# Patient Record
Sex: Male | Born: 1965 | Race: White | Hispanic: No | Marital: Single | Smoking: Light tobacco smoker
Health system: Southern US, Community
[De-identification: ages and names within clinical notes are randomized; demographics above are authoritative.]

## PROBLEM LIST (undated history)

## (undated) DIAGNOSIS — F419 Anxiety disorder, unspecified: Secondary | ICD-10-CM

## (undated) DIAGNOSIS — I499 Cardiac arrhythmia, unspecified: Secondary | ICD-10-CM

## (undated) HISTORY — DX: Cardiac arrhythmia, unspecified: I49.9

## (undated) HISTORY — PX: OTHER SURGICAL HISTORY: SHX169

---

## 2009-11-01 ENCOUNTER — Ambulatory Visit: Payer: Self-pay | Admitting: Diagnostic Radiology

## 2009-11-01 ENCOUNTER — Emergency Department (HOSPITAL_BASED_OUTPATIENT_CLINIC_OR_DEPARTMENT_OTHER): Admission: EM | Admit: 2009-11-01 | Discharge: 2009-11-01 | Payer: Self-pay | Admitting: Emergency Medicine

## 2010-10-01 ENCOUNTER — Ambulatory Visit: Payer: Self-pay | Admitting: Diagnostic Radiology

## 2010-10-01 ENCOUNTER — Emergency Department (HOSPITAL_BASED_OUTPATIENT_CLINIC_OR_DEPARTMENT_OTHER): Admission: EM | Admit: 2010-10-01 | Discharge: 2010-10-02 | Payer: Self-pay | Admitting: Emergency Medicine

## 2011-01-27 ENCOUNTER — Emergency Department (HOSPITAL_BASED_OUTPATIENT_CLINIC_OR_DEPARTMENT_OTHER)
Admission: EM | Admit: 2011-01-27 | Discharge: 2011-01-28 | Disposition: A | Discharge status: Home / Self Care | Payer: BC Managed Care – PPO | Attending: Emergency Medicine | Admitting: Emergency Medicine

## 2011-01-27 ENCOUNTER — Emergency Department (HOSPITAL_BASED_OUTPATIENT_CLINIC_OR_DEPARTMENT_OTHER): Payer: BC Managed Care – PPO

## 2011-01-27 DIAGNOSIS — F341 Dysthymic disorder: Secondary | ICD-10-CM | POA: Insufficient documentation

## 2011-01-27 DIAGNOSIS — R609 Edema, unspecified: Secondary | ICD-10-CM | POA: Insufficient documentation

## 2011-01-27 DIAGNOSIS — Z79899 Other long term (current) drug therapy: Secondary | ICD-10-CM | POA: Insufficient documentation

## 2011-01-27 DIAGNOSIS — M7989 Other specified soft tissue disorders: Secondary | ICD-10-CM | POA: Insufficient documentation

## 2011-01-27 DIAGNOSIS — L299 Pruritus, unspecified: Secondary | ICD-10-CM | POA: Insufficient documentation

## 2011-01-28 ENCOUNTER — Ambulatory Visit (HOSPITAL_BASED_OUTPATIENT_CLINIC_OR_DEPARTMENT_OTHER)
Admit: 2011-01-28 | Discharge: 2011-01-28 | Disposition: A | Discharge status: Home / Self Care | Payer: BC Managed Care – PPO | Attending: Emergency Medicine | Admitting: Emergency Medicine

## 2011-01-28 DIAGNOSIS — M7989 Other specified soft tissue disorders: Secondary | ICD-10-CM | POA: Insufficient documentation

## 2011-02-06 LAB — CBC
HCT: 50.2 % (ref 39.0–52.0)
Hemoglobin: 17.4 g/dL — ABNORMAL HIGH (ref 13.0–17.0)
MCH: 31.1 pg (ref 26.0–34.0)
MCHC: 34.7 g/dL (ref 30.0–36.0)
RDW: 12.2 % (ref 11.5–15.5)

## 2011-02-06 LAB — COMPREHENSIVE METABOLIC PANEL
CO2: 24 mEq/L (ref 19–32)
Calcium: 9.6 mg/dL (ref 8.4–10.5)
Creatinine, Ser: 1.1 mg/dL (ref 0.4–1.5)
GFR calc Af Amer: >60 mL/min (ref >60)
GFR calc non Af Amer: >60 mL/min (ref >60)
Glucose, Bld: 124 mg/dL — ABNORMAL HIGH (ref 70–99)
Sodium: 144 mEq/L (ref 135–145)
Total Protein: 8.2 g/dL (ref 6.0–8.3)

## 2011-02-06 LAB — DIFFERENTIAL
Basophils Absolute: 0.2 10*3/uL — ABNORMAL HIGH (ref 0.0–0.1)
Eosinophils Relative: 1 % (ref 0–5)
Lymphocytes Relative: 4 % — ABNORMAL LOW (ref 12–46)
Monocytes Relative: 11 % (ref 3–12)

## 2011-02-06 LAB — LIPASE, BLOOD: Lipase: 100 U/L (ref 23–300)

## 2011-02-27 LAB — CBC
HCT: 44.6 % (ref 39.0–52.0)
Hemoglobin: 15.3 g/dL (ref 13.0–17.0)
MCHC: 34.3 g/dL (ref 30.0–36.0)
MCV: 90.2 fL (ref 78.0–100.0)
Platelets: 253 10*3/uL (ref 150–400)
RBC: 4.95 MIL/uL (ref 4.22–5.81)
RDW: 12.6 % (ref 11.5–15.5)
WBC: 7.4 10*3/uL (ref 4.0–10.5)

## 2011-02-27 LAB — BASIC METABOLIC PANEL WITH GFR
BUN: 11 mg/dL (ref 6–23)
CO2: 28 meq/L (ref 19–32)
Calcium: 9.7 mg/dL (ref 8.4–10.5)
Creatinine, Ser: 0.9 mg/dL (ref 0.4–1.5)
Glucose, Bld: 96 mg/dL (ref 70–99)

## 2011-02-27 LAB — DIFFERENTIAL
Basophils Absolute: 0.3 K/uL — ABNORMAL HIGH (ref 0.0–0.1)
Basophils Relative: 4 % — ABNORMAL HIGH (ref 0–1)
Eosinophils Absolute: 0.1 K/uL (ref 0.0–0.7)
Eosinophils Relative: 2 % (ref 0–5)
Lymphocytes Relative: 27 % (ref 12–46)
Lymphs Abs: 2 10*3/uL (ref 0.7–4.0)
Monocytes Absolute: 0.4 10*3/uL (ref 0.1–1.0)
Monocytes Relative: 6 % (ref 3–12)
Neutro Abs: 4.6 K/uL (ref 1.7–7.7)
Neutrophils Relative %: 62 % (ref 43–77)

## 2011-02-27 LAB — POCT CARDIAC MARKERS
CKMB, poc: <1 ng/mL — ABNORMAL LOW (ref 1.0–8.0)
CKMB, poc: <1 ng/mL — ABNORMAL LOW (ref 1.0–8.0)
Myoglobin, poc: 40 ng/mL (ref 12–200)
Myoglobin, poc: 63.7 ng/mL (ref 12–200)
Troponin i, poc: <0.05 ng/mL (ref 0.00–0.09)
Troponin i, poc: <0.05 ng/mL (ref 0.00–0.09)

## 2011-02-27 LAB — BASIC METABOLIC PANEL
Chloride: 103 mEq/L (ref 96–112)
GFR calc Af Amer: >60 mL/min (ref >60)
GFR calc non Af Amer: >60 mL/min (ref >60)
Potassium: 3.9 mEq/L (ref 3.5–5.1)
Sodium: 143 mEq/L (ref 135–145)

## 2014-01-04 ENCOUNTER — Encounter (HOSPITAL_BASED_OUTPATIENT_CLINIC_OR_DEPARTMENT_OTHER): Payer: Self-pay | Admitting: Emergency Medicine

## 2014-01-04 ENCOUNTER — Emergency Department (HOSPITAL_BASED_OUTPATIENT_CLINIC_OR_DEPARTMENT_OTHER): Payer: BC Managed Care – PPO

## 2014-01-04 ENCOUNTER — Emergency Department (HOSPITAL_BASED_OUTPATIENT_CLINIC_OR_DEPARTMENT_OTHER)
Admission: EM | Admit: 2014-01-04 | Discharge: 2014-01-04 | Disposition: A | Discharge status: Home / Self Care | Payer: BC Managed Care – PPO | Attending: Emergency Medicine | Admitting: Emergency Medicine

## 2014-01-04 DIAGNOSIS — N200 Calculus of kidney: Secondary | ICD-10-CM | POA: Insufficient documentation

## 2014-01-04 DIAGNOSIS — F17200 Nicotine dependence, unspecified, uncomplicated: Secondary | ICD-10-CM | POA: Insufficient documentation

## 2014-01-04 DIAGNOSIS — Z79899 Other long term (current) drug therapy: Secondary | ICD-10-CM | POA: Insufficient documentation

## 2014-01-04 DIAGNOSIS — R112 Nausea with vomiting, unspecified: Secondary | ICD-10-CM | POA: Insufficient documentation

## 2014-01-04 DIAGNOSIS — Z880 Allergy status to penicillin: Secondary | ICD-10-CM | POA: Insufficient documentation

## 2014-01-04 LAB — URINE MICROSCOPIC-ADD ON

## 2014-01-04 LAB — URINALYSIS, ROUTINE W REFLEX MICROSCOPIC
Bilirubin Urine: NEGATIVE
GLUCOSE, UA: NEGATIVE mg/dL
Ketones, ur: NEGATIVE mg/dL
LEUKOCYTES UA: NEGATIVE
Nitrite: NEGATIVE
PROTEIN: NEGATIVE mg/dL
SPECIFIC GRAVITY, URINE: 1.023 (ref 1.005–1.030)
UROBILINOGEN UA: 0.2 mg/dL (ref 0.0–1.0)
pH: 5.5 (ref 5.0–8.0)

## 2014-01-04 MED ORDER — OXYCODONE-ACETAMINOPHEN 5-325 MG PO TABS: 2.0000 | ORAL_TABLET | ORAL | Status: DC | PRN

## 2014-01-04 MED ORDER — ONDANSETRON 8 MG PO TBDP: ORAL_TABLET | ORAL | Status: DC

## 2014-01-04 MED ORDER — TAMSULOSIN HCL 0.4 MG PO CAPS: 0.4000 mg | ORAL_CAPSULE | Freq: Every day | ORAL | Status: DC

## 2014-01-04 NOTE — ED Provider Notes (Signed)
CSN: [id number]     Arrival date & time 01/04/14  [id number] History   First MD Initiated Contact with Patient 01/04/14 0935     Chief Complaint  Patient presents with  . flank pain severe x 1 hour now painfree hx kidney stone      (Consider location/radiation/quality/duration/timing/severity/associated sxs/prior Treatment) HPI Comments: Patient presents with left flank pain. He states he does have a history of a kidney stone one time in the past which was 2 years ago. He states it passed on its own. He states at that time he was told that he had some stones in his left kidney. He states that this morning about 2 hours ago he noticed a sharp pain to his left back radiating to his left flank. He had some associated vomiting. He states it felt similar to his kidney stone however the pain subsided and is now pain-free. He denies any urinary symptoms or hematuria. He denies any fevers or chills. He denies any groin or testicular pain. He describes the pain as a sharp stabbing pain that was constant until about an hour ago when it subsided.   History reviewed. No pertinent past medical history. History reviewed. No pertinent past surgical history. History reviewed. No pertinent family history. History  Substance Use Topics  . Smoking status: Current Every Day Smoker  . Smokeless tobacco: Not on file  . Alcohol Use: Yes     Comment: occ    Review of Systems  Constitutional: Negative for fever, chills, diaphoresis and fatigue.  HENT: Negative for congestion, rhinorrhea and sneezing.   Eyes: Negative.   Respiratory: Negative for cough, chest tightness and shortness of breath.   Cardiovascular: Negative for chest pain and leg swelling.  Gastrointestinal: Positive for nausea and vomiting. Negative for abdominal pain, diarrhea and blood in stool.  Genitourinary: Negative for frequency, hematuria, flank pain, scrotal swelling, difficulty urinating and testicular pain.  Musculoskeletal: Positive for back  pain. Negative for arthralgias.  Skin: Negative for rash.  Neurological: Negative for dizziness, speech difficulty, weakness, numbness and headaches.      Allergies  Penicillins  Home Medications   Current Outpatient Rx  Name  Route  Sig  Dispense  Refill  . finasteride (PROPECIA) 1 MG tablet   Oral   Take 1 mg by mouth daily.         . ondansetron (ZOFRAN ODT) 8 MG disintegrating tablet      8mg  ODT q4 hours prn nausea   10 tablet   0   . oxyCODONE-acetaminophen (PERCOCET) 5-325 MG per tablet   Oral   Take 2 tablets by mouth every 4 (four) hours as needed.   20 tablet   0   . tamsulosin (FLOMAX) 0.4 MG CAPS capsule   Oral   Take 1 capsule (0.4 mg total) by mouth daily after supper.   10 capsule   0    BP 132/80  Pulse 77  Temp(Src) 98.1 F (36.7 C) (Oral)  Resp 16  SpO2 100% Physical Exam  Constitutional: He is oriented to person, place, and time. He appears well-developed and well-nourished.  HENT:  Head: Normocephalic and atraumatic.  Eyes: Pupils are equal, round, and reactive to light.  Neck: Normal range of motion. Neck supple.  Cardiovascular: Normal rate, regular rhythm and normal heart sounds.   Pulmonary/Chest: Effort normal and breath sounds normal. No respiratory distress. He has no wheezes. He has no rales. He exhibits no tenderness.  Abdominal: Soft. Bowel sounds are normal. There  is no tenderness. There is no rebound and no guarding.  Genitourinary:  No pain to the testicles  Musculoskeletal: Normal range of motion. He exhibits no edema.  Lymphadenopathy:    He has no cervical adenopathy.  Neurological: He is alert and oriented to person, place, and time.  Skin: Skin is warm and dry. No rash noted.  Psychiatric: He has a normal mood and affect.    ED Course  Procedures (including critical care time) Labs Review Results for orders placed during the hospital encounter of 01/04/14  URINALYSIS, ROUTINE W REFLEX MICROSCOPIC      Result  Value Range   Color, Urine YELLOW  YELLOW   APPearance CLOUDY (*) CLEAR   Specific Gravity, Urine 1.023  1.005 - 1.030   pH 5.5  5.0 - 8.0   Glucose, UA NEGATIVE  NEGATIVE mg/dL   Hgb urine dipstick LARGE (*) NEGATIVE   Bilirubin Urine NEGATIVE  NEGATIVE   Ketones, ur NEGATIVE  NEGATIVE mg/dL   Protein, ur NEGATIVE  NEGATIVE mg/dL   Urobilinogen, UA 0.2  0.0 - 1.0 mg/dL   Nitrite NEGATIVE  NEGATIVE   Leukocytes, UA NEGATIVE  NEGATIVE  URINE MICROSCOPIC-ADD ON      Result Value Range   Squamous Epithelial / LPF RARE  RARE   WBC, UA 0-2  <3 WBC/hpf   RBC / HPF TOO NUMEROUS TO COUNT  <3 RBC/hpf   Bacteria, UA MANY (*) RARE   Urine-Other MUCOUS PRESENT     Dg Abd 1 View  01/04/2014   CLINICAL DATA:  Left flank pain  EXAM: ABDOMEN - 1 VIEW  COMPARISON:  None.  FINDINGS: There is no bowel dilatation to suggest obstruction. There is no evidence of pneumoperitoneum, portal venous gas or pneumatosis. There is a 5.8 mm calcification projecting over the left psoas muscle at the level of L2-3 concerning for a left ureteral calculus. The osseous structures are unremarkable.  IMPRESSION: There is a 5.8 mm calcification projecting over the left psoas muscle at the level of L2-3 concerning for a left ureteral calculus.   Electronically Signed   By: Elige Ko   On: 01/04/2014 10:19     Imaging Review Dg Abd 1 View  01/04/2014   CLINICAL DATA:  Left flank pain  EXAM: ABDOMEN - 1 VIEW  COMPARISON:  None.  FINDINGS: There is no bowel dilatation to suggest obstruction. There is no evidence of pneumoperitoneum, portal venous gas or pneumatosis. There is a 5.8 mm calcification projecting over the left psoas muscle at the level of L2-3 concerning for a left ureteral calculus. The osseous structures are unremarkable.  IMPRESSION: There is a 5.8 mm calcification projecting over the left psoas muscle at the level of L2-3 concerning for a left ureteral calculus.   Electronically Signed   By: Elige Ko   On:  01/04/2014 10:19    EKG Interpretation   None       MDM   Final diagnoses:  Kidney stone    Pt had a KUB showing probable 5.42mm kidney stone on the left.  Given symptoms and hematuria, I feel that it is likely a passing kidney stone.  He is pain free now.  Given onset of symptoms was today, I did not feel that bloodwork is indicated.  Pt to f/u with urology.  Given urine strainer and rx for percocet, zofran, and flomax.    Rolan Bucco, MD 01/04/14 [phone]

## 2014-01-04 NOTE — ED Notes (Signed)
Patient given strainer for home use.

## 2014-01-04 NOTE — Discharge Instructions (Signed)
Kidney Stones  Kidney stones (urolithiasis) are deposits that form inside your kidneys. The intense pain is caused by the stone moving through the urinary tract. When the stone moves, the ureter goes into spasm around the stone. The stone is usually passed in the urine.   CAUSES   · A disorder that makes certain neck glands produce too much parathyroid hormone (primary hyperparathyroidism).  · A buildup of uric acid crystals, similar to gout in your joints.  · Narrowing (stricture) of the ureter.  · A kidney obstruction present at birth (congenital obstruction).  · Previous surgery on the kidney or ureters.  · Numerous kidney infections.  SYMPTOMS   · Feeling sick to your stomach (nauseous).  · Throwing up (vomiting).  · Blood in the urine (hematuria).  · Pain that usually spreads (radiates) to the groin.  · Frequency or urgency of urination.  DIAGNOSIS   · Taking a history and physical exam.  · Blood or urine tests.  · CT scan.  · Occasionally, an examination of the inside of the urinary bladder (cystoscopy) is performed.  TREATMENT   · Observation.  · Increasing your fluid intake.  · Extracorporeal shock wave lithotripsy This is a noninvasive procedure that uses shock waves to break up kidney stones.  · Surgery may be needed if you have severe pain or persistent obstruction. There are various surgical procedures. Most of the procedures are performed with the use of small instruments. Only small incisions are needed to accommodate these instruments, so recovery time is minimized.  The size, location, and chemical composition are all important variables that will determine the proper choice of action for you. Talk to your health care provider to better understand your situation so that you will minimize the risk of injury to yourself and your kidney.   HOME CARE INSTRUCTIONS   · Drink enough water and fluids to keep your urine clear or pale yellow. This will help you to pass the stone or stone fragments.  · Strain  all urine through the provided strainer. Keep all particulate matter and stones for your health care provider to see. The stone causing the pain may be as small as a grain of salt. It is very important to use the strainer each and every time you pass your urine. The collection of your stone will allow your health care provider to analyze it and verify that a stone has actually passed. The stone analysis will often identify what you can do to reduce the incidence of recurrences.  · Only take over-the-counter or prescription medicines for pain, discomfort, or fever as directed by your health care provider.  · Make a follow-up appointment with your health care provider as directed.  · Get follow-up X-rays if required. The absence of pain does not always mean that the stone has passed. It may have only stopped moving. If the urine remains completely obstructed, it can cause loss of kidney function or even complete destruction of the kidney. It is your responsibility to make sure X-rays and follow-ups are completed. Ultrasounds of the kidney can show blockages and the status of the kidney. Ultrasounds are not associated with any radiation and can be performed easily in a matter of minutes.  SEEK MEDICAL CARE IF:  · You experience pain that is progressive and unresponsive to any pain medicine you have been prescribed.  SEEK IMMEDIATE MEDICAL CARE IF:   · Pain cannot be controlled with the prescribed medicine.  · You have a fever   or shaking chills.  · The severity or intensity of pain increases over 18 hours and is not relieved by pain medicine.  · You develop a new onset of abdominal pain.  · You feel faint or pass out.  · You are unable to urinate.  MAKE SURE YOU:   · Understand these instructions.  · Will watch your condition.  · Will get help right away if you are not doing well or get worse.  Document Released: 11/12/2005 Document Revised: 07/15/2013 Document Reviewed: 04/15/2013  [employer]® Patient Information ©2014  [employer].

## 2014-01-04 NOTE — ED Notes (Signed)
Pt states had sharp left flank pain 2 hours ago so severe caused vomiting now pain free is concerned may have a kidney stone has had in past

## 2014-01-05 ENCOUNTER — Emergency Department (HOSPITAL_BASED_OUTPATIENT_CLINIC_OR_DEPARTMENT_OTHER): Payer: BC Managed Care – PPO

## 2014-01-05 ENCOUNTER — Encounter (HOSPITAL_BASED_OUTPATIENT_CLINIC_OR_DEPARTMENT_OTHER): Payer: Self-pay | Admitting: Emergency Medicine

## 2014-01-05 ENCOUNTER — Emergency Department (HOSPITAL_BASED_OUTPATIENT_CLINIC_OR_DEPARTMENT_OTHER)
Admission: EM | Admit: 2014-01-05 | Discharge: 2014-01-05 | Disposition: A | Discharge status: Home / Self Care | Payer: BC Managed Care – PPO | Attending: Emergency Medicine | Admitting: Emergency Medicine

## 2014-01-05 DIAGNOSIS — Z880 Allergy status to penicillin: Secondary | ICD-10-CM | POA: Insufficient documentation

## 2014-01-05 DIAGNOSIS — F17200 Nicotine dependence, unspecified, uncomplicated: Secondary | ICD-10-CM | POA: Insufficient documentation

## 2014-01-05 DIAGNOSIS — R112 Nausea with vomiting, unspecified: Secondary | ICD-10-CM | POA: Insufficient documentation

## 2014-01-05 DIAGNOSIS — N200 Calculus of kidney: Secondary | ICD-10-CM | POA: Insufficient documentation

## 2014-01-05 DIAGNOSIS — Z79899 Other long term (current) drug therapy: Secondary | ICD-10-CM | POA: Insufficient documentation

## 2014-01-05 MED ORDER — KETOROLAC TROMETHAMINE 30 MG/ML IJ SOLN
30.0000 mg | Freq: Once | INTRAMUSCULAR | Status: AC
  Administered 2014-01-05: 30 mg via INTRAVENOUS
  Filled 2014-01-05: qty 1

## 2014-01-05 MED ORDER — MORPHINE SULFATE 4 MG/ML IJ SOLN: 4.0000 mg | Freq: Once | INTRAMUSCULAR | Status: DC

## 2014-01-05 MED ORDER — MORPHINE SULFATE 4 MG/ML IJ SOLN
4.0000 mg | Freq: Once | INTRAMUSCULAR | Status: AC
  Administered 2014-01-05: 4 mg via INTRAVENOUS
  Filled 2014-01-05: qty 1

## 2014-01-05 MED ORDER — ONDANSETRON HCL 4 MG/2ML IJ SOLN
4.0000 mg | Freq: Once | INTRAMUSCULAR | Status: AC
  Administered 2014-01-05: 4 mg via INTRAVENOUS
  Filled 2014-01-05: qty 2

## 2014-01-05 MED ORDER — MORPHINE SULFATE 4 MG/ML IJ SOLN
4.0000 mg | Freq: Once | INTRAMUSCULAR | Status: AC
Start: 2014-01-05 — End: 2014-01-05
  Administered 2014-01-05: 4 mg via INTRAVENOUS
  Filled 2014-01-05: qty 1

## 2014-01-05 NOTE — ED Notes (Signed)
C/o left flank pain was seen yesterday for same  dzx w kidney  Was pain free till midnight and pain return  Took 4 pain pills without relief

## 2014-01-05 NOTE — Discharge Instructions (Signed)
Follow up with your urologist this morning as scheduled.   Kidney Stones Kidney stones (urolithiasis) are deposits that form inside your kidneys. The intense pain is caused by the stone moving through the urinary tract. When the stone moves, the ureter goes into spasm around the stone. The stone is usually passed in the urine.  CAUSES   A disorder that makes certain neck glands produce too much parathyroid hormone (primary hyperparathyroidism).  A buildup of uric acid crystals, similar to gout in your joints.  Narrowing (stricture) of the ureter.  A kidney obstruction present at birth (congenital obstruction).  Previous surgery on the kidney or ureters.  Numerous kidney infections. SYMPTOMS   Feeling sick to your stomach (nauseous).  Throwing up (vomiting).  Blood in the urine (hematuria).  Pain that usually spreads (radiates) to the groin.  Frequency or urgency of urination. DIAGNOSIS   Taking a history and physical exam.  Blood or urine tests.  CT scan.  Occasionally, an examination of the inside of the urinary bladder (cystoscopy) is performed. TREATMENT   Observation.  Increasing your fluid intake.  Extracorporeal shock wave lithotripsy This is a noninvasive procedure that uses shock waves to break up kidney stones.  Surgery may be needed if you have severe pain or persistent obstruction. There are various surgical procedures. Most of the procedures are performed with the use of small instruments. Only small incisions are needed to accommodate these instruments, so recovery time is minimized. The size, location, and chemical composition are all important variables that will determine the proper choice of action for you. Talk to your health care provider to better understand your situation so that you will minimize the risk of injury to yourself and your kidney.  HOME CARE INSTRUCTIONS   Drink enough water and fluids to keep your urine clear or pale yellow. This  will help you to pass the stone or stone fragments.  Strain all urine through the provided strainer. Keep all particulate matter and stones for your health care provider to see. The stone causing the pain may be as small as a grain of salt. It is very important to use the strainer each and every time you pass your urine. The collection of your stone will allow your health care provider to analyze it and verify that a stone has actually passed. The stone analysis will often identify what you can do to reduce the incidence of recurrences.  Only take over-the-counter or prescription medicines for pain, discomfort, or fever as directed by your health care provider.  Make a follow-up appointment with your health care provider as directed.  Get follow-up X-rays if required. The absence of pain does not always mean that the stone has passed. It may have only stopped moving. If the urine remains completely obstructed, it can cause loss of kidney function or even complete destruction of the kidney. It is your responsibility to make sure X-rays and follow-ups are completed. Ultrasounds of the kidney can show blockages and the status of the kidney. Ultrasounds are not associated with any radiation and can be performed easily in a matter of minutes. SEEK MEDICAL CARE IF:  You experience pain that is progressive and unresponsive to any pain medicine you have been prescribed. SEEK IMMEDIATE MEDICAL CARE IF:   Pain cannot be controlled with the prescribed medicine.  You have a fever or shaking chills.  The severity or intensity of pain increases over 18 hours and is not relieved by pain medicine.  You develop a  new onset of abdominal pain.  You feel faint or pass out.  You are unable to urinate. MAKE SURE YOU:   Understand these instructions.  Will watch your condition.  Will get help right away if you are not doing well or get worse. Document Released: 11/12/2005 Document Revised: 07/15/2013  Document Reviewed: 04/15/2013 [facility] Patient Information 2014 [place].

## 2014-01-05 NOTE — ED Notes (Signed)
Flank pain to left side since 12 midnight, no relief with po pain medications

## 2014-01-05 NOTE — ED Provider Notes (Signed)
CSN: [id number]     Arrival date & time 01/05/14  0408 History   First MD Initiated Contact with Patient 01/05/14 [phone]     Chief Complaint  Patient presents with  . Flank Pain     (Consider location/radiation/quality/duration/timing/severity/associated sxs/prior Treatment) HPI Comments: Patient is a 48 year old male who presents with complaints of left flank pain. He was seen for the same yesterday and had x-rays performed and was told he likely had a kidney stone. He has an appointment to see urology in approximately 4 hours, however his pain returned and is quite severe now. He is having nausea and vomiting. He denies any fevers or chills. She denies blood in the urine. He denies any bowel complaints.  Patient is a 48 y.o. male presenting with flank pain. The history is provided by the patient.  Flank Pain This is a new problem. The current episode started yesterday. The problem occurs constantly. The problem has been rapidly worsening. Associated symptoms include abdominal pain. Nothing aggravates the symptoms. Nothing relieves the symptoms. He has tried nothing for the symptoms. The treatment provided no relief.    History reviewed. No pertinent past medical history. History reviewed. No pertinent past surgical history. History reviewed. No pertinent family history. History  Substance Use Topics  . Smoking status: Current Every Day Smoker  . Smokeless tobacco: Not on file  . Alcohol Use: Yes     Comment: occ    Review of Systems  Gastrointestinal: Positive for abdominal pain.  Genitourinary: Positive for flank pain.  All other systems reviewed and are negative.      Allergies  Penicillins  Home Medications   Current Outpatient Rx  Name  Route  Sig  Dispense  Refill  . finasteride (PROPECIA) 1 MG tablet   Oral   Take 1 mg by mouth daily.         . ondansetron (ZOFRAN ODT) 8 MG disintegrating tablet      8mg  ODT q4 hours prn nausea   10 tablet   0   .  oxyCODONE-acetaminophen (PERCOCET) 5-325 MG per tablet   Oral   Take 2 tablets by mouth every 4 (four) hours as needed.   20 tablet   0   . tamsulosin (FLOMAX) 0.4 MG CAPS capsule   Oral   Take 1 capsule (0.4 mg total) by mouth daily after supper.   10 capsule   0    BP 143/76  Pulse 80  Temp(Src) 98.4 F (36.9 C) (Oral)  Resp 18  SpO2 100% Physical Exam  Nursing note and vitals reviewed. Constitutional: He is oriented to person, place, and time. He appears well-developed and well-nourished. No distress.  HENT:  Head: Normocephalic and atraumatic.  Neck: Normal range of motion. Neck supple.  Cardiovascular: Normal rate, regular rhythm and normal heart sounds.   No murmur heard. Pulmonary/Chest: Effort normal and breath sounds normal.  Abdominal: Soft. Bowel sounds are normal. He exhibits no distension and no mass. There is tenderness. There is no rebound and no guarding.  There is left-sided CVA tenderness present.  Musculoskeletal: Normal range of motion.  Neurological: He is alert and oriented to person, place, and time.  Skin: Skin is warm and dry. He is not diaphoretic.    ED Course  Procedures (including critical care time) Labs Review Labs Reviewed  URINALYSIS, ROUTINE W REFLEX MICROSCOPIC   Imaging Review Dg Abd 1 View  01/04/2014   CLINICAL DATA:  Left flank pain  EXAM: ABDOMEN - 1 VIEW  COMPARISON:  None.  FINDINGS: There is no bowel dilatation to suggest obstruction. There is no evidence of pneumoperitoneum, portal venous gas or pneumatosis. There is a 5.8 mm calcification projecting over the left psoas muscle at the level of L2-3 concerning for a left ureteral calculus. The osseous structures are unremarkable.  IMPRESSION: There is a 5.8 mm calcification projecting over the left psoas muscle at the level of L2-3 concerning for a left ureteral calculus.   Electronically Signed   By: Elige Ko   On: 01/04/2014 10:19      MDM   Final diagnoses:  None     Patient is a 48 year old male with left flank pain. He was seen earlier today medicated be performed. This was suggestive of a renal calculus. His pain substantially worsen tonight and returns for this. CT scan reveals a 6 mm stone in the distal ureter. He was given pain medication and is feeling better. He has a followup appointment with urology this morning. He will be discharged and advised to followup with them as scheduled.    Geoffery Lyons, MD 01/05/14 [phone]

## 2014-05-27 DIAGNOSIS — N201 Calculus of ureter: Secondary | ICD-10-CM | POA: Insufficient documentation

## 2014-05-27 DIAGNOSIS — N200 Calculus of kidney: Secondary | ICD-10-CM | POA: Insufficient documentation

## 2014-07-21 DIAGNOSIS — R1032 Left lower quadrant pain: Secondary | ICD-10-CM | POA: Insufficient documentation

## 2014-07-21 DIAGNOSIS — R05 Cough: Secondary | ICD-10-CM | POA: Insufficient documentation

## 2014-07-21 DIAGNOSIS — R5383 Other fatigue: Secondary | ICD-10-CM | POA: Insufficient documentation

## 2014-07-21 DIAGNOSIS — G4700 Insomnia, unspecified: Secondary | ICD-10-CM | POA: Insufficient documentation

## 2014-07-21 DIAGNOSIS — M25539 Pain in unspecified wrist: Secondary | ICD-10-CM | POA: Insufficient documentation

## 2014-07-21 DIAGNOSIS — S63529A Sprain of radiocarpal joint of unspecified wrist, initial encounter: Secondary | ICD-10-CM | POA: Insufficient documentation

## 2014-07-21 DIAGNOSIS — R5381 Other malaise: Secondary | ICD-10-CM | POA: Insufficient documentation

## 2014-07-21 DIAGNOSIS — R197 Diarrhea, unspecified: Secondary | ICD-10-CM | POA: Insufficient documentation

## 2014-07-21 DIAGNOSIS — G5620 Lesion of ulnar nerve, unspecified upper limb: Secondary | ICD-10-CM | POA: Insufficient documentation

## 2014-07-21 DIAGNOSIS — R110 Nausea: Secondary | ICD-10-CM | POA: Insufficient documentation

## 2014-07-21 DIAGNOSIS — F411 Generalized anxiety disorder: Secondary | ICD-10-CM | POA: Insufficient documentation

## 2014-07-21 DIAGNOSIS — J209 Acute bronchitis, unspecified: Secondary | ICD-10-CM | POA: Insufficient documentation

## 2014-07-21 DIAGNOSIS — F17200 Nicotine dependence, unspecified, uncomplicated: Secondary | ICD-10-CM | POA: Insufficient documentation

## 2014-07-21 DIAGNOSIS — R053 Chronic cough: Secondary | ICD-10-CM | POA: Insufficient documentation

## 2014-07-21 DIAGNOSIS — R509 Fever, unspecified: Secondary | ICD-10-CM | POA: Insufficient documentation

## 2014-07-21 DIAGNOSIS — M542 Cervicalgia: Secondary | ICD-10-CM | POA: Insufficient documentation

## 2014-07-21 DIAGNOSIS — G5600 Carpal tunnel syndrome, unspecified upper limb: Secondary | ICD-10-CM | POA: Insufficient documentation

## 2014-07-21 DIAGNOSIS — S63591A Other specified sprain of right wrist, initial encounter: Secondary | ICD-10-CM | POA: Insufficient documentation

## 2014-07-30 DIAGNOSIS — R1013 Epigastric pain: Secondary | ICD-10-CM | POA: Insufficient documentation

## 2014-07-30 DIAGNOSIS — S63509A Unspecified sprain of unspecified wrist, initial encounter: Secondary | ICD-10-CM | POA: Insufficient documentation

## 2016-09-07 DIAGNOSIS — R42 Dizziness and giddiness: Secondary | ICD-10-CM | POA: Insufficient documentation

## 2016-09-07 DIAGNOSIS — R079 Chest pain, unspecified: Secondary | ICD-10-CM | POA: Insufficient documentation

## 2017-01-09 DIAGNOSIS — I493 Ventricular premature depolarization: Secondary | ICD-10-CM | POA: Insufficient documentation

## 2017-05-16 DIAGNOSIS — M26629 Arthralgia of temporomandibular joint, unspecified side: Secondary | ICD-10-CM | POA: Insufficient documentation

## 2017-05-16 DIAGNOSIS — H9042 Sensorineural hearing loss, unilateral, left ear, with unrestricted hearing on the contralateral side: Secondary | ICD-10-CM | POA: Insufficient documentation

## 2017-05-16 DIAGNOSIS — H905 Unspecified sensorineural hearing loss: Secondary | ICD-10-CM | POA: Insufficient documentation

## 2017-05-16 DIAGNOSIS — H9313 Tinnitus, bilateral: Secondary | ICD-10-CM | POA: Insufficient documentation

## 2018-11-17 DIAGNOSIS — K219 Gastro-esophageal reflux disease without esophagitis: Secondary | ICD-10-CM | POA: Insufficient documentation

## 2019-02-27 ENCOUNTER — Other Ambulatory Visit: Payer: Self-pay

## 2019-02-27 ENCOUNTER — Encounter (HOSPITAL_BASED_OUTPATIENT_CLINIC_OR_DEPARTMENT_OTHER): Payer: Self-pay | Admitting: *Deleted

## 2019-02-27 ENCOUNTER — Emergency Department (HOSPITAL_BASED_OUTPATIENT_CLINIC_OR_DEPARTMENT_OTHER): Payer: Managed Care, Other (non HMO)

## 2019-02-27 ENCOUNTER — Emergency Department (HOSPITAL_BASED_OUTPATIENT_CLINIC_OR_DEPARTMENT_OTHER)
Admission: EM | Admit: 2019-02-27 | Discharge: 2019-02-27 | Disposition: A | Discharge status: Home / Self Care | Payer: Managed Care, Other (non HMO) | Attending: Emergency Medicine | Admitting: Emergency Medicine

## 2019-02-27 DIAGNOSIS — R079 Chest pain, unspecified: Secondary | ICD-10-CM | POA: Diagnosis not present

## 2019-02-27 HISTORY — DX: Anxiety disorder, unspecified: F41.9

## 2019-02-27 LAB — CBC
HCT: 47.4 % (ref 39.0–52.0)
Hemoglobin: 15.4 g/dL (ref 13.0–17.0)
MCH: 29.7 pg (ref 26.0–34.0)
MCHC: 32.5 g/dL (ref 30.0–36.0)
MCV: 91.3 fL (ref 80.0–100.0)
Platelets: 331 10*3/uL (ref 150–400)
RBC: 5.19 MIL/uL (ref 4.22–5.81)
RDW: 12.1 % (ref 11.5–15.5)
WBC: 8.5 10*3/uL (ref 4.0–10.5)
nRBC: 0 % (ref 0.0–0.2)

## 2019-02-27 LAB — TROPONIN I
Troponin I: <0.03 ng/mL (ref <0.03)
Troponin I: <0.03 ng/mL (ref <0.03)

## 2019-02-27 LAB — BASIC METABOLIC PANEL
Anion gap: 7 (ref 5–15)
BUN: 15 mg/dL (ref 6–20)
CO2: 27 mmol/L (ref 22–32)
Calcium: 8.8 mg/dL — ABNORMAL LOW (ref 8.9–10.3)
Chloride: 98 mmol/L (ref 98–111)
Creatinine, Ser: 0.95 mg/dL (ref 0.61–1.24)
GFR calc Af Amer: >60 mL/min (ref >60)
GFR calc non Af Amer: >60 mL/min (ref >60)
Glucose, Bld: 106 mg/dL — ABNORMAL HIGH (ref 70–99)
Potassium: 3.9 mmol/L (ref 3.5–5.1)
Sodium: 132 mmol/L — ABNORMAL LOW (ref 135–145)

## 2019-02-27 MED ORDER — SODIUM CHLORIDE 0.9% FLUSH
3.0000 mL | Freq: Once | INTRAVENOUS | Status: DC
  Filled 2019-02-27: qty 3

## 2019-02-27 NOTE — ED Notes (Signed)
States was leaving his very stressful job x 30 min ago and felt chest pain and felt," out of it"

## 2019-02-27 NOTE — Discharge Instructions (Signed)
Follow-up with your primary care doctor.  As we discussed, follow-up with your cardiologist for further evaluation.  Return to the Emergency Department immediately if you experiencing worsening chest pain, difficulty breathing, nausea/vomiting, get very sweaty, headache or any other worsening or concerning symptoms.

## 2019-02-27 NOTE — ED Triage Notes (Signed)
Sharp left sided chest pain. Diaphoresis. He became SOB.

## 2019-02-27 NOTE — ED Provider Notes (Signed)
[facility] EMERGENCY DEPARTMENT Provider Note   CSN: [id number] Arrival date & time: 02/27/19  1717    History   Chief Complaint Chief Complaint  Patient presents with  . Chest Pain    HPI Eugene Fields is a 53 y.o. male with past medical history anxiety who presents for evaluation of chest pain that began about 45 minutes prior to ED arrival.  Patient states that he was walking out of work to his car when all of a sudden he got a "pinching type burning type" pain across the left anterior chest.  He states that he got diaphoretic with the pain but did not have any nausea or vomiting.  He states he did not have any difficulty breathing.  He states that he has had this pain intermittently for the last several weeks.  He states it is not tied to a particular action but he had noticed that more when he came home from work.  Patient states that the pain was not worse with deep inspiration or with exertion.  He states that today's pain was little bit more severe, prompting ED visit.  He has not taken any medications.  On ED arrival, he rates his pain a 1/10.  He states it is not worse with deep inspiration.  Patient states that he does not have any numbness/weakness in his arms or legs.  He states he is a current smoker and smokes about 5 to 6 cigarettes a day.  He also reports constant beeping throughout the day.  Patient denies any cocaine use.  He denies any personal history of diabetes, hypertension.  He denies any personal cardiac history or family cardiac history.  Patient denies any abdominal pain, fevers, chills, congestion, cough.  Patient reports about a month ago, he traveled to [place]. He denies any recent travel, recent immobilization, prior history of DVT/PE, recent surgery, leg swelling, or long travel.   HPI: A 53 year old patient presents for evaluation of chest pain. Initial onset of pain was less than one hour ago. The patient's chest pain is well-localized, is  described as heaviness/pressure/tightness, is sharp and is not worse with exertion. The patient reports some diaphoresis. The patient's chest pain is middle- or left-sided and does not radiate to the arms/jaw/neck. The patient does not complain of nausea. The patient has smoked in the past 90 days. The patient has no history of stroke, has no history of peripheral artery disease, denies any history of treated diabetes, has no relevant family history of coronary artery disease (first degree relative at less than age 2), is not hypertensive, has no history of hypercholesterolemia and does not have an elevated BMI (>=30).   The history is provided by the patient.    Past Medical History:  Diagnosis Date  . Anxiety     There are no active problems to display for this patient.   Past Surgical History:  Procedure Laterality Date  . arm surgery          Home Medications    Prior to Admission medications   Medication Sig Start Date End Date Taking? Authorizing Provider  CLONAZEPAM PO Take by mouth.   Yes [provider]  finasteride (PROPECIA) 1 MG tablet Take 1 mg by mouth daily.   Yes [provider]  ondansetron (ZOFRAN ODT) 8 MG disintegrating tablet  ODT q4 hours prn nausea 01/04/14   Rolan Bucco, MD  oxyCODONE-acetaminophen (PERCOCET) 5-325 MG per tablet Take 2 tablets by mouth every 4 (four) hours  as needed. 01/04/14   Rolan Bucco, MD  tamsulosin (FLOMAX) 0.4 MG CAPS capsule Take 1 capsule (0.4 mg total) by mouth daily after supper. 01/04/14   Rolan Bucco, MD    Family History No family history on file.  Social History Social History   Tobacco Use  . Smoking status: Current Every Day Smoker  . Smokeless tobacco: Never Used  Substance Use Topics  . Alcohol use: Yes    Comment: occ  . Drug use: No     Allergies   Penicillins   Review of Systems Review of Systems  Constitutional: Positive for diaphoresis. Negative for fever.  Respiratory:  Negative for cough and shortness of breath.   Cardiovascular: Positive for chest pain. Negative for leg swelling.  Gastrointestinal: Negative for abdominal pain, nausea and vomiting.  Genitourinary: Negative for dysuria and hematuria.  Neurological: Negative for headaches.  All other systems reviewed and are negative.    Physical Exam Updated Vital Signs BP 139/87 (BP Location: Right Arm)   Pulse 69   Temp 98.4 F (36.9 C) (Oral)   Resp 18   Ht 5\' 9"  (1.753 m)   Wt 77.1 kg   SpO2 98%   BMI 25.10 kg/m   Physical Exam Vitals signs and nursing note reviewed.  Constitutional:      Appearance: Normal appearance. He is well-developed.  HENT:     Head: Normocephalic and atraumatic.  Eyes:     General: Lids are normal.     Conjunctiva/sclera: Conjunctivae normal.     Pupils: Pupils are equal, round, and reactive to light.  Neck:     Musculoskeletal: Full passive range of motion without pain.  Cardiovascular:     Rate and Rhythm: Normal rate and regular rhythm.     Pulses: Normal pulses.          Radial pulses are 2+ on the right side and 2+ on the left side.       Dorsalis pedis pulses are 2+ on the right side and 2+ on the left side.     Heart sounds: Normal heart sounds. No murmur. No friction rub. No gallop.   Pulmonary:     Effort: Pulmonary effort is normal.     Breath sounds: Normal breath sounds.     Comments: Lungs clear to auscultation bilaterally.  Symmetric chest rise.  No wheezing, rales, rhonchi. Abdominal:     Palpations: Abdomen is soft. Abdomen is not rigid.     Tenderness: There is no abdominal tenderness. There is no guarding.     Comments: Abdomen is soft, non-distended, non-tender. No rigidity, No guarding. No peritoneal signs.  Musculoskeletal: Normal range of motion.  Skin:    General: Skin is warm and dry.     Capillary Refill: Capillary refill takes less than 2 seconds.  Neurological:     Mental Status: He is alert and oriented to person, place,  and time.  Psychiatric:        Speech: Speech normal.      ED Treatments / Results  Labs (all labs ordered are listed, but only abnormal results are displayed) Labs Reviewed  BASIC METABOLIC PANEL - Abnormal; Notable for the following components:      Result Value   Sodium 132 (*)    Glucose, Bld 106 (*)    Calcium 8.8 (*)    All other components within normal limits  CBC  TROPONIN I  TROPONIN I    EKG EKG Interpretation  Date/Time:  Friday February 27 2019 17:24:27 EDT Ventricular Rate:  83 PR Interval:  122 QRS Duration: 80 QT Interval:  360 QTC Calculation: 423 R Axis:   92 Text Interpretation:  Sinus rhythm with frequent Premature ventricular complexes Right atrial enlargement Rightward axis Borderline ECG since last tracing no significant change Confirmed by Rolan Bucco [phone]) on 02/27/2019 6:16:32 PM   Radiology Dg Chest 2 View  Result Date: 02/27/2019 CLINICAL DATA:  Chest pain EXAM: CHEST - 2 VIEW COMPARISON:  10/09/2017 FINDINGS: The heart size and mediastinal contours are within normal limits. Both lungs are clear. The visualized skeletal structures are unremarkable. IMPRESSION: No active cardiopulmonary disease. Electronically Signed   By: Elige Ko   On: 02/27/2019 17:53    Procedures Procedures (including critical care time)  Medications Ordered in ED Medications  sodium chloride flush (NS) 0.9 % injection 3 mL (3 mLs Intravenous Not Given 02/27/19 1745)     Initial Impression / Assessment and Plan / ED Course  I have reviewed the triage vital signs and the nursing notes.  Pertinent labs & imaging results that were available during my care of the patient were reviewed by me and considered in my medical decision making (see chart for details).     HEAR Score: 81  53 year old male who presents for evaluation of chest pain that began 45 minutes prior to ED arrival.  Reports he was walking at work when on Sunday he got chest pain on the left side that  he described as a tightness, burning type sharp pain.  He states that he got diaphoretic with pain.  No associated nausea/vomiting.  Not worse with exertion.  Not worse with deep inspiration.  No associated shortness of breath.  He states he has been having this pain intermittently for the last several weeks.  He had been seen by cardiology in 2000 and 17/2018.  At that time, he was found to have irregular heart rhythm.  He states he had a stress test done at that time that was unremarkable.  He states he is current smoker and vapor.  He denies any personal history of high blood pressure or diabetes.  Consider ACS etiology versus stress/anxiety.  History/physical exam not concerning for dissection. Patient is PERC negative and is low risk for PE. Do not suspect PE.   CBC shows no evidence of leukocytosis or anemia.  BMP is unremarkable.  Troponin is normal.  Chest x-ray negative for any infectious etiology.  Given patient's risk factors, history, he has a heart score of 3.  We will plan to delta troponin.  Delta troponin is negative.   Discussed results with patient.  He reports he is not currently having any pain.  Vital signs stable.  I discussed with patient that he may need follow-up with his cardiologist given that this is been an ongoing issue for last several weeks.  He has had cardiology evaluation before where he had a Holter monitor which diagnosis PVCs.  Instructed him to contact his PCP as well as his cardiologist for further evaluation. At this time, patient exhibits no emergent life-threatening condition that require further evaluation in ED or admission. Patient had ample opportunity for questions and discussion. All patient's questions were answered with full understanding. Strict return precautions discussed. Patient expresses understanding and agreement to plan.   Portions of this note were generated with Scientist, clinical (histocompatibility and immunogenetics). Dictation errors may occur despite best attempts at  proofreading.  Final Clinical Impressions(s) / ED Diagnoses   Final diagnoses:  Nonspecific  chest pain    ED Discharge Orders    None       Rosana Hoes 02/27/19 2212    Rolan Bucco, MD 02/27/19 2230

## 2019-02-27 NOTE — ED Notes (Signed)
Pt up to BR, otherwise resting in bed. Aware repeat lab needed.  Pt denies needs at this time. Denies CP, no apparent physical distress.  WCTM, pt remains on monitor.

## 2019-02-27 NOTE — ED Notes (Signed)
Repeat trop drawn from existing line at this time. Line flushed per protocol, pt tol well. Aware will be awaiting results.. No apparent physical distress. Denies needs.  WCTM.

## 2019-03-20 ENCOUNTER — Ambulatory Visit (INDEPENDENT_AMBULATORY_CARE_PROVIDER_SITE_OTHER): Payer: Managed Care, Other (non HMO) | Admitting: Cardiology

## 2019-03-20 ENCOUNTER — Encounter: Payer: Self-pay | Admitting: Cardiology

## 2019-03-20 ENCOUNTER — Other Ambulatory Visit: Payer: Self-pay

## 2019-03-20 VITALS — Ht 69.0 in | Wt 175.0 lb

## 2019-03-20 DIAGNOSIS — I493 Ventricular premature depolarization: Secondary | ICD-10-CM | POA: Diagnosis not present

## 2019-03-20 DIAGNOSIS — R002 Palpitations: Secondary | ICD-10-CM

## 2019-03-20 DIAGNOSIS — R0789 Other chest pain: Secondary | ICD-10-CM

## 2019-03-20 NOTE — Progress Notes (Signed)
Virtual Visit via Video Note: This visit type was conducted due to national recommendations for restrictions regarding the COVID-19 Pandemic (e.g. social distancing).  This format is felt to be most appropriate for this patient at this time.  All issues noted in this document were discussed and addressed.  No physical exam was performed (except for noted visual exam findings with Telehealth visits).  The patient has consented to conduct a Telehealth visit and understands insurance will be billed.   I connected with@, on 03/20/19 at  by a video enabled telemedicine application and verified that I am speaking with the correct person using two identifiers.   I discussed the limitations of evaluation and management by telemedicine and the availability of in person appointments. The patient expressed understanding and agreed to proceed.   I have discussed with patient regarding the safety during COVID Pandemic and steps and precautions to be taken including social distancing, frequent hand wash and use of detergent soap, gels with the patient. I asked the patient to avoid touching mouth, nose, eyes, ears with the hands. I encouraged regular walking around the neighborhood and exercise and regular diet, as long as social distancing can be maintained.   Primary Physician/Referring:  Andreas Blowerabeza, Yuri M., MD  Patient ID: Eugene Fields, male    DOB: 02/09/1966, 53 y.o.   MRN: [id number]  Chief Complaint  Patient presents with  . New Patient (Initial Visit)    Hospital f/u  . Chest Pain    HPI: Eugene Fields  is a 53 y.o. male  with Palpitations ongoing for several years, no change, mostly noted during rest and not with exertion activity, evaluated in the emergency room with chest discomfort on 02/27/2019.  Chest pain described as Sudden pinching across the left upper part of his chest and sometimes associated with tingling and numbness in his left arm.  Episodes can last anywhere from 30 minutes to  several hours and no specific relieving or exacerbating factors.  Denies any specific correlation with exertion.  No change in symptoms of palpitations with the chest pain.  He denies recent weight changes, No family history of sudden cardiac death, no leg edema and no recent long travel.  He does smoke a few cigarettes, e-cigarettes occasionally.  Past Medical History:  Diagnosis Date  . Anxiety   . Arrhythmia     Past Surgical History:  Procedure Laterality Date  . arm surgery      Social History   Socioeconomic History  . Marital status: Single    Spouse name: Not on file  . Number of children: Not on file  . Years of education: Not on file  . Highest education level: Not on file  Occupational History  . Not on file  Social Needs  . Financial resource strain: Not on file  . Food insecurity:    Worry: Not on file    Inability: Not on file  . Transportation needs:    Medical: Not on file    Non-medical: Not on file  Tobacco Use  . Smoking status: Light Tobacco Smoker    Packs/day: 0.25    Types: Cigarettes, E-cigarettes  . Smokeless tobacco: Never Used  Substance and Sexual Activity  . Alcohol use: Yes    Comment: socially  . Drug use: No  . Sexual activity: Yes  Lifestyle  . Physical activity:    Days per week: Not on file    Minutes per session: Not on file  . Stress: Not on file  Relationships  . Social connections:    Talks on phone: Not on file    Gets together: Not on file    Attends religious service: Not on file    Active member of club or organization: Not on file    Attends meetings of clubs or organizations: Not on file    Relationship status: Not on file  . Intimate partner violence:    Fear of current or ex partner: Not on file    Emotionally abused: Not on file    Physically abused: Not on file    Forced sexual activity: Not on file  Other Topics Concern  . Not on file  Social History Narrative  . Not on file    Current Outpatient  Medications on File Prior to Visit  Medication Sig Dispense Refill  . clonazePAM (KLONOPIN) 0.5 MG tablet Take by mouth daily.     . DUTASTERIDE PO Take by mouth daily. 0.7 singular ota, not atarvart    . finasteride (PROSCAR) 5 MG tablet Take by mouth as needed.    Marland Kitchen ketoconazole (NIZORAL) 2 % shampoo Apply topically. Once a week     No current facility-administered medications on file prior to visit.     Review of Systems  Constitution: Negative for chills, decreased appetite, malaise/fatigue and weight gain.  Cardiovascular: Positive for chest pain and palpitations. Negative for claudication, dyspnea on exertion, leg swelling and syncope.  Endocrine: Negative for cold intolerance.  Hematologic/Lymphatic: Does not bruise/bleed easily.  Musculoskeletal: Negative for joint swelling.  Gastrointestinal: Negative for abdominal pain, anorexia and change in bowel habit.  Neurological: Negative for headaches and light-headedness.  Psychiatric/Behavioral: Negative for depression and substance abuse. The patient is nervous/anxious.   All other systems reviewed and are negative.     Objective:  Height 5\' 9"  (1.753 m), weight 175 lb (79.4 kg). Body mass index is 25.84 kg/m.  Physical Exam  Constitutional: He appears well-developed and well-nourished. No distress.  Neck: Neck supple.  Pulmonary/Chest: Effort normal.  Psychiatric: He has a normal mood and affect.   Radiology: No results found. Laboratory Examination:    CMP Latest Ref Rng & Units 02/27/2019 10/01/2010 11/01/2009  Glucose 70 - 99 mg/dL 865(H) 846(N) 96  BUN 6 - 20 mg/dL 15 16 11   Creatinine 0.61 - 1.24 mg/dL 6.29 1.1 0.9  Sodium 528 - 145 mmol/L 132(L) 144 143  Potassium 3.5 - 5.1 mmol/L 3.9 4.3 3.9  Chloride 98 - 111 mmol/L 98 102 103  CO2 22 - 32 mmol/L 27 24 28   Calcium 8.9 - 10.3 mg/dL 4.1(L) 9.6 9.7  Total Protein 6.0 - 8.3 g/dL - 8.2 -  Total Bilirubin 0.3 - 1.2 mg/dL - 0.9 -  Alkaline Phos 39 - 117 U/L - 79 -   AST 0 - 37 U/L - 36 -  ALT 0 - 53 U/L - 37 -   CBC Latest Ref Rng & Units 02/27/2019 10/01/2010 11/01/2009  WBC 4.0 - 10.5 K/uL 8.5 19.8(H) 7.4  Hemoglobin 13.0 - 17.0 g/dL 24.4 17.4(H) 15.3  Hematocrit 39.0 - 52.0 % 47.4 50.2 44.6  Platelets 150 - 400 K/uL 331 262 253   Lipid Panel  No results found for: CHOL, TRIG, HDL, CHOLHDL, VLDL, LDLCALC, LDLDIRECT HEMOGLOBIN A1C No results found for: HGBA1C, MPG TSH No results for input(s): TSH in the last 8760 hours.  Cardiac studies:   None Available  Assessment:    Musculoskeletal chest pain  Palpitations  PVC (premature ventricular contraction) - Plan:  PCV ECHOCARDIOGRAM COMPLETE, PCV ECHOCARDIOGRAM COMPLETE  EKG 02/27/2019:  Normal sinus rhythm at the rate of 83 bpm, right atrial enlargement, right axis deviation, no evidence of ischemia.  Frequent PVCs.  Unifocal.  3 in number in poorly EKG.  CXR 02/27/2019: No active cardiopulmonary disease.  Recommendations:    I have reviewed the charts from the emergency room and also reviewed the EKG personally rate the EKG and also reviewed the chest x-ray.  Labs are all within normal limits.  His chest pain symptoms appeared to be musculoskeletal, patient was able to go Jogging and also prescribed walking without any chest pain or dyspnea. Suspicion for ACS or significant CAD is much less.  He also states that he has been going to a chiropractor, has neck issues and this may also be a radicular pain.  But also suspect he may have GERD-like symptoms.  Advised him to try OTC PPI.  I'm concerned about abnormal EKG suggesting right ventricular strain and presence of right atrial abnormality.  These are chronic, do not suspect pulmonary embolism.  He needs further evaluation, he has had Holter monitor and treadmill stress test and an echocardiogram in 2018 at [facility] the results of which are not available to me in care everywhere, but they were told to be normal.  However in view of new symptoms  and abnormal EKG, I'll perform an echocardiogram and if I find structural abnormalities he will need further evaluation.  I'll like to see him in the clinic in one month for follow-up.  Patient wishes to have echo done soon as his very anxious to know what is happening, I tried to set him up for a later date in a month in view Covid-19. I will further discuss complete abstinence of tobacco at his in-office visit.   All questions answered, this is a telemedicine visit.  Yates Decamp, MD, [facility] 03/20/2019, 4:29 PM [facility] Pager: [phone] Office: [phone] If no answer Cell [phone]

## 2019-04-16 ENCOUNTER — Other Ambulatory Visit: Payer: Managed Care, Other (non HMO)

## 2019-04-21 ENCOUNTER — Ambulatory Visit: Payer: Managed Care, Other (non HMO) | Admitting: Cardiology

## 2019-05-08 ENCOUNTER — Other Ambulatory Visit: Payer: Self-pay

## 2019-05-08 ENCOUNTER — Ambulatory Visit (INDEPENDENT_AMBULATORY_CARE_PROVIDER_SITE_OTHER): Payer: Managed Care, Other (non HMO)

## 2019-05-08 DIAGNOSIS — I493 Ventricular premature depolarization: Secondary | ICD-10-CM | POA: Diagnosis not present

## 2019-05-13 ENCOUNTER — Other Ambulatory Visit: Payer: Self-pay

## 2019-05-13 ENCOUNTER — Encounter: Payer: Self-pay | Admitting: Cardiology

## 2019-05-13 ENCOUNTER — Ambulatory Visit: Payer: Managed Care, Other (non HMO) | Admitting: Cardiology

## 2019-05-13 ENCOUNTER — Ambulatory Visit: Payer: Managed Care, Other (non HMO)

## 2019-05-13 VITALS — BP 131/85 | HR 66 | Temp 98.0°F | Ht 68.0 in | Wt 166.0 lb

## 2019-05-13 DIAGNOSIS — I493 Ventricular premature depolarization: Secondary | ICD-10-CM

## 2019-05-13 DIAGNOSIS — K219 Gastro-esophageal reflux disease without esophagitis: Secondary | ICD-10-CM | POA: Diagnosis not present

## 2019-05-13 DIAGNOSIS — R002 Palpitations: Secondary | ICD-10-CM

## 2019-05-13 NOTE — Progress Notes (Signed)
Primary Physician/Referring:  Kristopher Glee., MD  Patient ID: Eugene Fields, male    DOB: 02-18-1966, 53 y.o.   MRN: [id number]  Chief Complaint  Patient presents with  . Chest Pain    f/u  test results   . Palpitations   HPI: Eugene Fields  is a 53 y.o. male  with Palpitations ongoing for several years, no change, mostly noted during rest and not with exertion activity, evaluated in the emergency room with chest discomfort on 02/27/2019.  I had seen him 6 weeks ago and felt that the chest pain was muscular skeletal and also probably related to GERD, he took over-the-counter PPI with complete resolution of chest pain.  He denies recent weight changes, No family history of sudden cardiac death, no leg edema and no recent long travel.  He does smoke a few cigarettes, e-cigarettes occasionally.  Past Medical History:  Diagnosis Date  . Anxiety   . Arrhythmia     Past Surgical History:  Procedure Laterality Date  . arm surgery      Social History   Socioeconomic History  . Marital status: Single    Spouse name: Not on file  . Number of children: Not on file  . Years of education: Not on file  . Highest education level: Not on file  Occupational History  . Not on file  Social Needs  . Financial resource strain: Not on file  . Food insecurity    Worry: Not on file    Inability: Not on file  . Transportation needs    Medical: Not on file    Non-medical: Not on file  Tobacco Use  . Smoking status: Light Tobacco Smoker    Packs/day: 0.25    Types: Cigarettes, E-cigarettes  . Smokeless tobacco: Never Used  Substance and Sexual Activity  . Alcohol use: Yes    Comment: socially  . Drug use: No  . Sexual activity: Yes  Lifestyle  . Physical activity    Days per week: Not on file    Minutes per session: Not on file  . Stress: Not on file  Relationships  . Social Herbalist on phone: Not on file    Gets together: Not on file    Attends religious  service: Not on file    Active member of club or organization: Not on file    Attends meetings of clubs or organizations: Not on file    Relationship status: Not on file  . Intimate partner violence    Fear of current or ex partner: Not on file    Emotionally abused: Not on file    Physically abused: Not on file    Forced sexual activity: Not on file  Other Topics Concern  . Not on file  Social History Narrative  . Not on file    Current Outpatient Medications on File Prior to Visit  Medication Sig Dispense Refill  . clonazePAM (KLONOPIN) 0.5 MG tablet Take by mouth daily.     . DUTASTERIDE PO Take by mouth daily. 0.7 singular ota, not atarvart    . finasteride (PROSCAR) 5 MG tablet Take by mouth as needed.    Marland Kitchen ketoconazole (NIZORAL) 2 % shampoo Apply topically. Once a week     No current facility-administered medications on file prior to visit.    Review of Systems  Constitution: Negative for chills, decreased appetite, malaise/fatigue and weight gain.  Cardiovascular: Positive for palpitations. Negative for chest pain, claudication, dyspnea  on exertion, leg swelling and syncope.  Endocrine: Negative for cold intolerance.  Hematologic/Lymphatic: Does not bruise/bleed easily.  Musculoskeletal: Negative for joint swelling.  Gastrointestinal: Negative for abdominal pain, anorexia and change in bowel habit.  Neurological: Negative for headaches and light-headedness.  Psychiatric/Behavioral: Negative for depression and substance abuse. The patient is nervous/anxious.   All other systems reviewed and are negative.     Objective:  Blood pressure 131/85, pulse 66, temperature 98 F (36.7 C), height 5\' 8"  (1.727 m), weight 166 lb (75.3 kg), SpO2 100 %. Body mass index is 25.24 kg/m.  Physical Exam  Constitutional: He appears well-developed and well-nourished. No distress.  HENT:  Head: Atraumatic.  Eyes: Conjunctivae are normal.  Neck: Neck supple. No JVD present. No  thyromegaly present.  Cardiovascular: Normal rate, regular rhythm, normal heart sounds and intact distal pulses.  Occasional extrasystoles are present. Exam reveals no gallop.  No murmur heard. Pulmonary/Chest: Effort normal and breath sounds normal.  Abdominal: Soft. Bowel sounds are normal.  Musculoskeletal: Normal range of motion.  Neurological: He is alert.  Skin: Skin is warm and dry.  Psychiatric: He has a normal mood and affect.   Radiology: No results found. Laboratory Examination:    CMP Latest Ref Rng & Units 02/27/2019 10/01/2010 11/01/2009  Glucose 70 - 99 mg/dL 161(W106(H) 960(A124(H) 96  BUN 6 - 20 mg/dL 15 16 11   Creatinine 0.61 - 1.24 mg/dL 5.400.95 1.1 0.9  Sodium 981135 - 145 mmol/L 132(L) 144 143  Potassium 3.5 - 5.1 mmol/L 3.9 4.3 3.9  Chloride 98 - 111 mmol/L 98 102 103  CO2 22 - 32 mmol/L 27 24 28   Calcium 8.9 - 10.3 mg/dL 1.9(J8.8(L) 9.6 9.7  Total Protein 6.0 - 8.3 g/dL - 8.2 -  Total Bilirubin 0.3 - 1.2 mg/dL - 0.9 -  Alkaline Phos 39 - 117 U/L - 79 -  AST 0 - 37 U/L - 36 -  ALT 0 - 53 U/L - 37 -   CBC Latest Ref Rng & Units 02/27/2019 10/01/2010 11/01/2009  WBC 4.0 - 10.5 K/uL 8.5 19.8(H) 7.4  Hemoglobin 13.0 - 17.0 g/dL 47.815.4 17.4(H) 15.3  Hematocrit 39.0 - 52.0 % 47.4 50.2 44.6  Platelets 150 - 400 K/uL 331 262 253   Lipid Panel  No results found for: CHOL, TRIG, HDL, CHOLHDL, VLDL, LDLCALC, LDLDIRECT HEMOGLOBIN A1C No results found for: HGBA1C, MPG TSH No results for input(s): TSH in the last 8760 hours.  Cardiac studies:   Echocardiogram 05/08/2019: Mildly depressed LV systolic function with EF 50%. Left ventricle cavity is normal in size. Normal global wall motion. Normal diastolic filling pattern. Frequent PVC's seen. Calculated EF 50%. Mild aortic valve leaflet calcification. No aortic valve regurgitation noted. Small circumferential pericardial effusion with no hemodynamic compromize. IVC is dilated with respiratory variation. Estimated RA pressure 8 mmHg.   Holter/Event monitor 24 hours 05/13/2019: There were 7500 PVCs noted in 24 hours with overall PVC burden off 13%.  No symptoms reported.  Assessment:    Gastroesophageal reflux disease without esophagitis   Frequent PVCs - Plan: HOLTER MONITOR - 24 HOUR, PCV ECHOCARDIOGRAM COMPLETE  EKG 02/27/2019:  Normal sinus rhythm at the rate of 83 bpm, right atrial enlargement, right axis deviation, no evidence of ischemia.  Frequent PVCs.  Unifocal.  3 in number.  CXR 02/27/2019: No active cardiopulmonary disease.  Holter monitor 12/10/2016: Frequent PVCs noted.  Total count not reported.  Recommendations:    Patient presents to me for follow-up of echocardiogram, chest  pain suggestive of GERD and also palpitations and PVCs.  He took PPI with complete resolution of chest discomfort again suggesting GERD as an etiology.  He is asymptomatic with regard to chest pain. He exercises regularly without any chest pain and he has had a negative treadmill stress test in 2018 although complete test results not available to me.  I'm concerned about ongoing frequent PVCs, abnormal EKG suggestive of rightward axis and right atrial abnormality, although echo cardiac and does not reveal any RV systolic dysfunction, he does have frequent PVCs.  I'd like to repeat his Holter monitor to evaluate frequency and burden of PVCs for 24 hours.  In view of low normal LVEF, I'll repeat echocardiogram in a year and see him back at that time.  I have discussed with him regarding use of beta blockers if in case his PVC burden is greater than 10,000.  He would like to try natural supplements, advised him to try fish, increase omega-3 fatty acid in his diet and also B12 and B6 vitamins. Advised him to f/u on his lipids with PCP  Addendum: Holter/Event report included.  The PVC burden in 13% and total 7500. Will continue to observe.  Yates DecampJay Jelina Paulsen, MD, [facility] 05/13/2019, 11:19 AM [facility] [place] Pager: [phone] Office:  [phone] If no answer Cell [phone]

## 2019-05-14 ENCOUNTER — Encounter: Payer: Self-pay | Admitting: Cardiology

## 2020-05-05 ENCOUNTER — Other Ambulatory Visit: Payer: Self-pay

## 2020-05-05 ENCOUNTER — Ambulatory Visit: Payer: PRIVATE HEALTH INSURANCE

## 2020-05-05 DIAGNOSIS — I493 Ventricular premature depolarization: Secondary | ICD-10-CM

## 2020-05-12 ENCOUNTER — Ambulatory Visit: Payer: Managed Care, Other (non HMO) | Admitting: Cardiology

## 2020-05-12 ENCOUNTER — Encounter: Payer: Self-pay | Admitting: Cardiology

## 2020-05-12 ENCOUNTER — Other Ambulatory Visit: Payer: Self-pay

## 2020-05-12 VITALS — BP 129/79 | HR 73 | Resp 16 | Ht 68.0 in | Wt 167.0 lb

## 2020-05-12 DIAGNOSIS — R002 Palpitations: Secondary | ICD-10-CM

## 2020-05-12 DIAGNOSIS — I493 Ventricular premature depolarization: Secondary | ICD-10-CM

## 2020-05-12 MED ORDER — B-6 50 MG PO TABS: 1.0000 | ORAL_TABLET | Freq: Every day | ORAL | Status: DC

## 2020-05-12 NOTE — Progress Notes (Signed)
Primary Physician/Referring:  Andreas Blower., MD  Patient ID: Eugene Fields, male    DOB: 03-23-1966, 54 y.o.   MRN: [id number]  Chief Complaint  Patient presents with  . Frequent PVC's  . Results    Echocardiogram  . Follow-up    1 year   HPI:    Eugene Fields  is a 54 y.o. Caucasian male with chronic palpitations, mostly noted during rest, with no family history of sudden cardiac death, otherwise asymptomatic.  He was seen by [place] a year ago and echocardiogram had revealed mild decrease in LVEF at 50% on 05/08/2019 and event monitor/Holter monitor for 24 hours revealed 13% PVC burden.  He now presents here for annual visit and follow-up.  Except for occasional palpitations he has no symptoms.  Continues to exercise regularly.  Past Medical History:  Diagnosis Date  . Anxiety   . Arrhythmia    Past Surgical History:  Procedure Laterality Date  . arm surgery     Family History  Problem Relation Age of Onset  . Lung cancer Brother     Social History   Tobacco Use  . Smoking status: Light Tobacco Smoker    Packs/day: 0.25    Types: Cigarettes, E-cigarettes  . Smokeless tobacco: Never Used  Substance Use Topics  . Alcohol use: Yes    Comment: socially   Marital Status: Single  ROS  Review of Systems  Cardiovascular: Positive for palpitations. Negative for chest pain, dyspnea on exertion and leg swelling.  Gastrointestinal: Negative for melena.   Objective  Blood pressure 129/79, pulse 73, resp. rate 16, height 5\' 8"  (1.727 m), weight 167 lb (75.8 kg), SpO2 98 %.  Vitals with BMI 05/12/2020 05/13/2019 03/20/2019  Height 5\' 8"  5\' 8"  5\' 9"   Weight 167 lbs 166 lbs 175 lbs  BMI 25.4 25.25 25.83  Systolic 129 131 (No Data)  Diastolic 79 85 (No Data)  Pulse 73 66 -     Physical Exam Cardiovascular:     Rate and Rhythm: Normal rate and regular rhythm. Occasional extrasystoles are present.    Pulses: Intact distal pulses.     Heart sounds: Normal heart sounds. No  murmur heard.  No gallop.      Comments: No leg edema, no JVD. Pulmonary:     Effort: Pulmonary effort is normal.     Breath sounds: Normal breath sounds.  Abdominal:     General: Bowel sounds are normal.     Palpations: Abdomen is soft.    Laboratory examination:   No results for input(s): NA, K, CL, CO2, GLUCOSE, BUN, CREATININE, CALCIUM, GFRNONAA, GFRAA in the last 8760 hours. CrCl cannot be calculated (Patient's most recent lab result is older than the maximum 21 days allowed.).  CMP Latest Ref Rng & Units 02/27/2019 10/01/2010 11/01/2009  Glucose 70 - 99 mg/dL ) 04/29/2019) 96  BUN 6 - 20 mg/dL 15 16 11   Creatinine 0.61 - 1.24 mg/dL 13/04/2010 1.1 0.9  Sodium 14/05/2009 - 145 mmol/L 132(L) 144 143  Potassium 3.5 - 5.1 mmol/L 3.9 4.3 3.9  Chloride 98 - 111 mmol/L 98 102 103  CO2 22 - 32 mmol/L 27 24 28   Calcium 8.9 - 10.3 mg/dL 195(K) 9.6 9.7  Total Protein 6.0 - 8.3 g/dL - 8.2 -  Total Bilirubin 0.3 - 1.2 mg/dL - 0.9 -  Alkaline Phos 39 - 117 U/L - 79 -  AST 0 - 37 U/L - 36 -  ALT 0 - 53 U/L - 37 -  CBC Latest Ref Rng & Units 02/27/2019 10/01/2010 11/01/2009  WBC 4.0 - 10.5 K/uL 8.5 19.8(H) 7.4  Hemoglobin 13.0 - 17.0 g/dL 15.4 17.4(H) 15.3  Hematocrit 39 - 52 % 47.4 50.2 44.6  Platelets 150 - 400 K/uL 331 262 253    External labs:   Hemoglobin 15.400 02/27/2019 Creatinine, Serum 0.950 02/27/2019 Potassium 3.900 02/27/2019  Medications and allergies   Allergies  Allergen Reactions  . Penicillins      Current Outpatient Medications  Medication Instructions  . clonazePAM (KLONOPIN) 0.5 MG tablet Oral, Daily  . Cyanocobalamin (VITAMIN B-12) 1000 MCG SUBL 1 tablet, Sublingual, Daily  . DUTASTERIDE PO Oral, Daily, 0.7 singular ota, not atarvart   . ketoconazole (NIZORAL) 2 % shampoo Topical, Once a week  . Pyridoxine HCl (B-6) 50 MG TABS 1 tablet, Oral, Daily    Medications Discontinued During This Encounter  Medication Reason  . finasteride (PROSCAR) 5 MG tablet Change in therapy      Radiology:   CXR 02/27/2019: No active cardiopulmonary disease.  Cardiac Studies:   Echocardiogram 05/05/2020:  Mildly depressed LV systolic function with EF 50%. Left ventricle cavity  is normal in size. Normal global wall motion. Doppler evidence of grade I  (impaired) diastolic dysfunction, normal LAP. Calculated EF 50%.  Structurally normal trileaflet aortic valve. Aortic sclerosis without  stenosis. Trace aortic regurgitation.  Small pericardial effusion with clear fluids. There is no hemodynamic  significance.  No significant change from prior study dated 05/08/2019 except now there  is Grade I diastolic impairment.   Holter/Event monitor 24 hours 05/13/2019: There were 7500 PVCs noted in 24 hours with overall PVC burden off 13%.  No symptoms reported.  EKG   EKG 05/12/2020: Sinus bradycardia at rate of 59 bpm, normal axis.  No evidence of ischemia.  Normal EKG.   02/27/2019:  Normal sinus rhythm at the rate of 83 bpm, right atrial enlargement, right axis deviation, no evidence of ischemia.  Frequent PVCs.  Unifocal.  3 in number.  Assessment     ICD-10-CM   1. Frequent PVCs  I49.3 EKG 12-Lead    Pyridoxine HCl (B-6) 50 MG TABS  2. Palpitations  R00.2     Recommendations:   Eugene Fields  is a 54 y.o. Caucasian male with chronic palpitations, mostly noted during rest, with no family history of sudden cardiac death, otherwise asymptomatic.  He was seen by [place] a year ago and echocardiogram had revealed mild decrease in LVEF at 50% on 05/08/2019 and event monitor/Holter monitor for 24 hours revealed 13% PVC burden.  He now presents here for annual visit and follow-up.  He is completely asymptomatic except for occasional palpitations, he is aware that he has got PVCs if he checks his pulse.  He continues to remain active and exercises religiously on a daily basis.  I reviewed his echocardiogram, essentially normal echocardiogram, borderline low LVEF is related to his  athlete's heart as he exercises rigorously.  He has been using vitamin B12 as he was found to have B12 deficiency.  I also advised him to try adding pyridoxine 50 mg p.o. daily which may help with his PVCs/palpitations.  Otherwise stable from cardiac standpoint, I will see him back in 3 years and may repeat an echocardiogram to follow-up on his LVEF.  I do not have his lipids are his TSH, I believe they are normal as per patient.  Adrian Prows, MD, [facility] 05/12/2020, 12:21 PM [facility]. [place] Office: [phone]

## 2021-03-22 ENCOUNTER — Emergency Department (HOSPITAL_BASED_OUTPATIENT_CLINIC_OR_DEPARTMENT_OTHER): Payer: 59

## 2021-03-22 ENCOUNTER — Encounter (HOSPITAL_BASED_OUTPATIENT_CLINIC_OR_DEPARTMENT_OTHER): Payer: Self-pay

## 2021-03-22 ENCOUNTER — Other Ambulatory Visit: Payer: Self-pay

## 2021-03-22 ENCOUNTER — Emergency Department (HOSPITAL_BASED_OUTPATIENT_CLINIC_OR_DEPARTMENT_OTHER)
Admission: EM | Admit: 2021-03-22 | Discharge: 2021-03-22 | Disposition: A | Discharge status: Home / Self Care | Payer: 59 | Attending: Emergency Medicine | Admitting: Emergency Medicine

## 2021-03-22 DIAGNOSIS — M542 Cervicalgia: Secondary | ICD-10-CM | POA: Diagnosis not present

## 2021-03-22 DIAGNOSIS — F17210 Nicotine dependence, cigarettes, uncomplicated: Secondary | ICD-10-CM | POA: Diagnosis not present

## 2021-03-22 DIAGNOSIS — R202 Paresthesia of skin: Secondary | ICD-10-CM | POA: Diagnosis present

## 2021-03-22 DIAGNOSIS — R42 Dizziness and giddiness: Secondary | ICD-10-CM | POA: Insufficient documentation

## 2021-03-22 DIAGNOSIS — G540 Brachial plexus disorders: Secondary | ICD-10-CM

## 2021-03-22 DIAGNOSIS — H538 Other visual disturbances: Secondary | ICD-10-CM | POA: Insufficient documentation

## 2021-03-22 LAB — CBC WITH DIFFERENTIAL/PLATELET
Abs Immature Granulocytes: 0.01 10*3/uL (ref 0.00–0.07)
Basophils Absolute: 0.1 10*3/uL (ref 0.0–0.1)
Basophils Relative: 1 %
Eosinophils Absolute: 0.3 10*3/uL (ref 0.0–0.5)
Eosinophils Relative: 4 %
HCT: 47.1 % (ref 39.0–52.0)
Hemoglobin: 15.5 g/dL (ref 13.0–17.0)
Immature Granulocytes: 0 %
Lymphocytes Relative: 28 %
Lymphs Abs: 2.2 10*3/uL (ref 0.7–4.0)
MCH: 29.8 pg (ref 26.0–34.0)
MCHC: 32.9 g/dL (ref 30.0–36.0)
MCV: 90.6 fL (ref 80.0–100.0)
Monocytes Absolute: 0.7 10*3/uL (ref 0.1–1.0)
Monocytes Relative: 9 %
Neutro Abs: 4.7 10*3/uL (ref 1.7–7.7)
Neutrophils Relative %: 58 %
Platelets: 309 10*3/uL (ref 150–400)
RBC: 5.2 MIL/uL (ref 4.22–5.81)
RDW: 12.7 % (ref 11.5–15.5)
WBC: 8 10*3/uL (ref 4.0–10.5)
nRBC: 0 % (ref 0.0–0.2)

## 2021-03-22 LAB — BASIC METABOLIC PANEL
Anion gap: 10 (ref 5–15)
BUN: 15 mg/dL (ref 6–20)
CO2: 25 mmol/L (ref 22–32)
Calcium: 9.2 mg/dL (ref 8.9–10.3)
Chloride: 101 mmol/L (ref 98–111)
Creatinine, Ser: 0.82 mg/dL (ref 0.61–1.24)
GFR, Estimated: >60 mL/min (ref >60)
Glucose, Bld: 100 mg/dL — ABNORMAL HIGH (ref 70–99)
Potassium: 4.2 mmol/L (ref 3.5–5.1)
Sodium: 136 mmol/L (ref 135–145)
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
## 2021-03-22 NOTE — ED Notes (Signed)
Pt discharged home with instructions, pt verbalized understanding of instructions.  Sign pad not working for signature. Pt ambulatory to POV to home

## 2021-03-22 NOTE — ED Provider Notes (Signed)
Emergency Medicine Provider Triage Evaluation Note  Eugene Fields, a 55 y.o. male evaluated in triage.  Pt complains of heaviness/limp feeling left arm, began 4pm, improving. Recurrent problem, followed by neurology. No numbness. No other assoc symptoms today.  BP (!) 141/96 (BP Location: Left Arm)   Pulse 67   Temp 98.3 F (36.8 C) (Oral)   Resp 18   Ht 5\' 9"  (1.753 m)   Wt 74.8 kg   SpO2 100%   BMI 24.37 kg/m   Patient is alert, no acute distress, normal work of breathing. NO focal neuro deficits, no strength deficits, no weakness, normal coordination, normal tone, no pronator drift    Medically screening exam initiated at 5:56 PM. Appropriate orders placed.  Eugene Fields was informed that the remainder of the evaluation will be completed by another provider, this initial triage assessment does not replace that evaluation, and the importance of remaining in the ED until their evaluation is complete.       Eugene Fields, Isabella Stalling N, PA-C 03/22/21 1757    03/24/21, MD 03/22/21 2034

## 2021-03-22 NOTE — ED Triage Notes (Addendum)
Pt c/o left UE "feels lifeless", numbness sx started ~4pm-"feels like it is slowly coming back"-reports he has had similar sx x 1 year including blurred vision and dizziness that is worse x 1-2 months-c/o intermittent HA x "few months"-states he was seen for neuro for HAs only-PA in triage for MSE-NAD-steady gait

## 2021-03-22 NOTE — Discharge Instructions (Signed)
Make an appointment to follow-up both with neurology and hand surgery.  As we discussed there could be impingement of nerves in the neck there also could be a brachial plexus abnormality causing the symptoms.  Unlikely to be brain but MRI of brain and neck would be appropriate.  Return for any new or worse symptoms.  That have been different from what is been occurring.

## 2021-05-13 ENCOUNTER — Other Ambulatory Visit: Payer: Self-pay

## 2021-05-13 ENCOUNTER — Emergency Department (HOSPITAL_BASED_OUTPATIENT_CLINIC_OR_DEPARTMENT_OTHER): Payer: 59

## 2021-05-13 ENCOUNTER — Observation Stay (HOSPITAL_BASED_OUTPATIENT_CLINIC_OR_DEPARTMENT_OTHER)
Admission: EM | Admit: 2021-05-13 | Discharge: 2021-05-14 | Disposition: A | Discharge status: Home / Self Care | Payer: 59 | Attending: Internal Medicine | Admitting: Internal Medicine

## 2021-05-13 ENCOUNTER — Encounter (HOSPITAL_BASED_OUTPATIENT_CLINIC_OR_DEPARTMENT_OTHER): Payer: Self-pay | Admitting: *Deleted

## 2021-05-13 DIAGNOSIS — G459 Transient cerebral ischemic attack, unspecified: Secondary | ICD-10-CM | POA: Diagnosis not present

## 2021-05-13 DIAGNOSIS — F17210 Nicotine dependence, cigarettes, uncomplicated: Secondary | ICD-10-CM | POA: Diagnosis not present

## 2021-05-13 DIAGNOSIS — Z20822 Contact with and (suspected) exposure to covid-19: Secondary | ICD-10-CM | POA: Diagnosis not present

## 2021-05-13 DIAGNOSIS — R42 Dizziness and giddiness: Secondary | ICD-10-CM

## 2021-05-13 DIAGNOSIS — R519 Headache, unspecified: Secondary | ICD-10-CM | POA: Diagnosis present

## 2021-05-13 LAB — CBC WITH DIFFERENTIAL/PLATELET
Abs Immature Granulocytes: 0.01 10*3/uL (ref 0.00–0.07)
Basophils Absolute: 0.1 10*3/uL (ref 0.0–0.1)
Basophils Relative: 1 %
Eosinophils Absolute: 0.2 10*3/uL (ref 0.0–0.5)
Eosinophils Relative: 2 %
HCT: 44.3 % (ref 39.0–52.0)
Hemoglobin: 15 g/dL (ref 13.0–17.0)
Immature Granulocytes: 0 %
Lymphocytes Relative: 28 %
Lymphs Abs: 2.5 10*3/uL (ref 0.7–4.0)
MCH: 30.2 pg (ref 26.0–34.0)
MCHC: 33.9 g/dL (ref 30.0–36.0)
MCV: 89.3 fL (ref 80.0–100.0)
Monocytes Absolute: 0.7 10*3/uL (ref 0.1–1.0)
Monocytes Relative: 8 %
Neutro Abs: 5.6 10*3/uL (ref 1.7–7.7)
Neutrophils Relative %: 61 %
Platelets: 346 10*3/uL (ref 150–400)
RBC: 4.96 MIL/uL (ref 4.22–5.81)
RDW: 12.5 % (ref 11.5–15.5)
WBC: 9 10*3/uL (ref 4.0–10.5)
nRBC: 0 % (ref 0.0–0.2)

## 2021-05-13 LAB — COMPREHENSIVE METABOLIC PANEL
ALT: 12 U/L (ref 0–44)
AST: 21 U/L (ref 15–41)
Albumin: 4.3 g/dL (ref 3.5–5.0)
Alkaline Phosphatase: 46 U/L (ref 38–126)
Anion gap: 7 (ref 5–15)
BUN: 11 mg/dL (ref 6–20)
CO2: 25 mmol/L (ref 22–32)
Calcium: 9.1 mg/dL (ref 8.9–10.3)
Chloride: 106 mmol/L (ref 98–111)
Creatinine, Ser: 0.92 mg/dL (ref 0.61–1.24)
GFR, Estimated: >60 mL/min (ref >60)
Glucose, Bld: 91 mg/dL (ref 70–99)
Potassium: 3.9 mmol/L (ref 3.5–5.1)
Sodium: 138 mmol/L (ref 135–145)
Total Bilirubin: 0.6 mg/dL (ref 0.3–1.2)
Total Protein: 7.3 g/dL (ref 6.5–8.1)

## 2021-05-13 MED ORDER — NICOTINE 21 MG/24HR TD PT24
21.0000 mg | MEDICATED_PATCH | Freq: Once | TRANSDERMAL | Status: AC
  Administered 2021-05-14: 21 mg via TRANSDERMAL
  Filled 2021-05-13: qty 1

## 2021-05-13 NOTE — ED Notes (Signed)
Currently online with neurology consult.
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
## 2021-05-13 NOTE — ED Triage Notes (Signed)
Pt reports episode of  vision "spinning" today around 5pm. States he was sitting in backyard when it started and lasted about 4 minutes. Pt denies dizziness. States resolved at present. Also has hx of "focal seizures" and had 2 of them today, one at 1345 and again at 1530. Grips equal and strong, facial symmetry present, speech clear, alert and oriented, no drift.

## 2021-05-14 ENCOUNTER — Observation Stay (HOSPITAL_COMMUNITY): Payer: 59

## 2021-05-14 DIAGNOSIS — R42 Dizziness and giddiness: Secondary | ICD-10-CM

## 2021-05-14 DIAGNOSIS — F17210 Nicotine dependence, cigarettes, uncomplicated: Secondary | ICD-10-CM | POA: Diagnosis not present

## 2021-05-14 DIAGNOSIS — R519 Headache, unspecified: Secondary | ICD-10-CM | POA: Diagnosis present

## 2021-05-14 DIAGNOSIS — G459 Transient cerebral ischemic attack, unspecified: Secondary | ICD-10-CM | POA: Diagnosis not present

## 2021-05-14 DIAGNOSIS — Z20822 Contact with and (suspected) exposure to covid-19: Secondary | ICD-10-CM | POA: Diagnosis not present

## 2021-05-14 LAB — RESP PANEL BY RT-PCR (FLU A&B, COVID) ARPGX2
Influenza A by PCR: NEGATIVE
Influenza B by PCR: NEGATIVE
SARS Coronavirus 2 by RT PCR: NEGATIVE

## 2021-05-14 MED ORDER — SODIUM CHLORIDE 0.9 % IV BOLUS
1000.0000 mL | Freq: Once | INTRAVENOUS | Status: AC
Start: 2021-05-14 — End: 2021-05-14
  Administered 2021-05-14: 1000 mL via INTRAVENOUS

## 2021-05-14 MED ORDER — NICOTINE 21 MG/24HR TD PT24: 21.0000 mg | MEDICATED_PATCH | Freq: Once | TRANSDERMAL | 0 refills | Status: AC

## 2021-05-14 MED ORDER — IBUPROFEN 400 MG PO TABS
600.0000 mg | ORAL_TABLET | Freq: Once | ORAL | Status: AC
  Administered 2021-05-14: 600 mg via ORAL
  Filled 2021-05-14: qty 1

## 2021-05-14 MED ORDER — GADOBUTROL 1 MMOL/ML IV SOLN
7.5000 mL | Freq: Once | INTRAVENOUS | Status: AC | PRN
  Administered 2021-05-14: 7.5 mL via INTRAVENOUS

## 2021-05-14 NOTE — ED Notes (Signed)
Report called to Lestine Mount at [facility] ER for transfer for MRI while awaiting bed assignment.  [facility] en route for transport

## 2021-05-14 NOTE — ED Notes (Signed)
Pt approved by Dr Anitra Lauth to ED to ED transfer to [facility] awaiting bed assignment and MRI.  Dr Lynelle Doctor accepting.

## 2021-05-14 NOTE — Consult Note (Signed)
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 It was of sudden onset and suspicious for posterior circulation etiology vs vestibular vs cardiac given h/o arrhythmia. Would recommend admission for full stroke work-up to evaluate.  CT HEAD: Showed No Acute Hemorrhage or Acute Core Infarct  Our recommendations are outlined below.  Diagnostic Studies: Recommend MRI brain without contrast Routine MRA head without contrast and MRA Neck with contrast  Laboratory Studies: Recommend Lipid panel Hemoglobin A1c TSH  Medication: Initiate Aspirin 325 mg daily  Nursing Recommendations: Telemetry, IV Fluids, avoid dextrose containing fluids, Maintain euglycemia Neuro checks q4 hrs x 24 hrs and then per shift Head of bed 30 degrees  Consultations: Recommend Speech therapy if failed dysphagia screen Physical therapy/Occupational therapy  DVT Prophylaxis: Choice of Primary Team  Disposition: Neurology will follow   Imaging: reviewed  Labs: reviewed  Metrics: [facility] Notification Time: 05/13/2021 20:48:57 Stamp Time: 05/13/2021 20:50:09 Callback Response Time: 05/13/2021 20:51:20   ----------------------------------------------------------------------------------------------------  Chief Complaint: dizziness  History of Present Illness: Patient is a 55 year old Male.  Today pt was sitting in a recliner in the backyard. Then suddenly everything started spinning counter clockwise for 2-3 minutes. Then everything switched directions and started spinning clockwise which lasted a few minutes too and then stopped. This was completely different from his usual dizzy spells and  vertigo that he's experienced before. No alcohol, no drugs, no allergies, no recent illness, no head trauma. Pt does have cough and congestion since this AM but nothing severe. Pt denies any associated focal neurologic or bulbar deficit. He does still have a headache. Over the last few months he's been having headaches daily, pressure tension-type headaches. No other associated symptoms. One time 2 years ago, for a period of 10 seconds he had an episode of feeling like he was falling to the L (he was sitting so no actual fall), it resolved so no medical attention was sought. Pt takes clonazepam daily in the evening for sleep, has not missed any doses. Of note, he was in the hospital 2 months ago because of heaviness in the LUE. He reports that stroke was ruled out, he has had an EMG, no etiology was found. He sees a neurologist for this. Pt reports that he's had an arrhythmia since he was 22 years, but he is not on any medication for it. He has had a couple of stress tests which were normal.    Past Medical History:      There is NO history of Hypertension      There is NO history of Diabetes Mellitus      There is NO history of Hyperlipidemia      There is NO history of Atrial Fibrillation      There is NO history of Coronary Artery Disease      There is NO history of Stroke      There is NO history of Covid-19      Pt is a daily smoker, social etoh, uses only cbd.  Anticoagulant use:  No  Antiplatelet use: No    Examination: BP(130/72), Pulse(68), Blood Glucose(100) 1A: Level of Consciousness - Alert; keenly responsive + 0 1B: Ask Month and Age -  Both Questions Right + 0 1C: Blink Eyes & Squeeze Hands - Performs Both Tasks + 0 2: Test Horizontal Extraocular Movements - Normal + 0 3: Test Visual Fields - No Visual Loss + 0 4: Test Facial Palsy (Use Grimace if Obtunded) - Normal symmetry + 0 5A: Test Left Arm Motor Drift - No Drift for 10 Seconds + 0 5B: Test Right Arm Motor Drift - No  Drift for 10 Seconds + 0 6A: Test Left Leg Motor Drift - No Drift for 5 Seconds + 0 6B: Test Right Leg Motor Drift - No Drift for 5 Seconds + 0 7: Test Limb Ataxia (FNF/Heel-Shin) - No Ataxia + 0 8: Test Sensation - Normal; No sensory loss + 0 9: Test Language/Aphasia - Normal; No aphasia + 0 10: Test Dysarthria - Normal + 0 11: Test Extinction/Inattention - No abnormality + 0  NIHSS Score: 0     Patient / Family was informed the Neurology Consult would occur via TeleHealth consult by way of interactive audio and video telecommunications and consented to receiving care in this manner.  Patient is being evaluated for possible acute neurologic impairment and high probability of imminent or life - threatening deterioration.I spent total of 35 minutes providing care to this patient, including time for face to face visit via telemedicine, review of medical records, imaging studies and discussion of findings with providers, the patient and / or family.   Dr Shade Flood   [facility] [phone]  Case [id number]

## 2021-05-14 NOTE — Consult Note (Signed)
Medical Consultation   Eugene Fields  ZOX:[id number]  DOB: 04/18/1966  DOA: 05/13/2021  PCP: Andreas Blowerabeza, Yuri M., MD  Outpatient Specialists: Neurology Dr. Charmayne SheerEric Moser.   Requesting physician: [facility] Ant[facility].  Accepted the patient as an ED [facility] ED [facility].  Reason for consultation: Vertigo  History of Present Illness: Eugene Stallingimothy Hallett is an 55 y.o. male chronic anxiety, tobacco use disorder [facility] with complaints of vertigo..  Per teleneurology Dr. Kallie EdwardPirmohamed pt was sitting in a recliner in the backyard. Then suddenly everything started spinning counter clockwise for 2-3 minutes. Then everything switched directions and started spinning clockwise which lasted a few minutes too and then stopped. This was completely different from his usual dizzy spells and vertigo that he's experienced before. No alcohol, no drugs, no allergies, no recent illness, no head trauma. Pt does have cough and congestion since this AM but nothing severe. Pt denies any associated focal neurologic or bulbar deficit. He does still have a headache. Over the last few months he's been having headaches daily, pressure tension-type headaches. No other associated symptoms. One time 2 years ago, for a period of 10 seconds he had an episode of feeling like he was falling to the L (he was sitting so no actual fall), it resolved so no medical attention was sought. Pt takes clonazepam daily in the evening for sleep, has not missed any doses. Of note, he was in the hospital 2 months ago because of heaviness in the LUE. He reports that stroke was ruled out, he has had an EMG, no etiology was found. He sees a neurologist for this. Pt reports that he's had an arrhythmia since he was 22 years, but he is not on any medication for it. He has had a couple of stress tests which were normal.  [facility], had an MRI brain without contrast, MRA head  and neck with and without contrast.  No acute findings.  IMPRESSION: MRI brain:   1. No evidence of acute intracranial abnormality. 2. 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 No surrounding edema or evidence of interval hemorrhage. 3. Minimal chronic small vessel ischemic changes within the cerebral white matter, unchanged.   MRA head:   1. No intracranial large vessel occlusion or proximal arterial stenosis. 2. 2 mm posteriorly projecting vascular protrusion arising from the proximal cavernous right ICA, which may reflect an aneurysm or infundibulum. 3. Mild nonspecific focal fusiform dilation of the V4 right vertebral artery. A small fusiform aneurysm cannot be excluded.   MRA neck:   The common carotid, internal carotid and vertebral arteries are patent within the neck without appreciable stenosis.     Electronically Signed   By: Jackey LogeKyle  Golden DO   On: 05/14/2021 19:02    At the time of this visit, his symptomatology had resolved.  Patient advised to follow-up with his primary neurologist outpatient.  Patient understands and agrees with plan.   Review of Systems:  ROS As per HPI otherwise 10 point review of systems negative.     Past Medical History: Past Medical History:  Diagnosis Date   Anxiety    Arrhythmia     Past Surgical History: Past Surgical History:  Procedure Laterality Date   arm surgery       Allergies:   Allergies  Allergen Reactions   Gadavist [Gadobutrol] Other (  See Comments)    Pt reports a feeling of "lava" shooting through arm and to his head and eyes after administration.    Penicillins      Social History:  reports that he has been smoking cigarettes and e-cigarettes. He has been smoking an average of 0.25 packs per day. He has never used smokeless tobacco. He reports current alcohol use. He reports that he does not use drugs.   Family History: Family History  Problem Relation Age of  Onset   Lung cancer Brother      Physical Exam: Vitals:   05/14/21 1300 05/14/21 1450 05/14/21 1741 05/14/21 1804  BP: 122/85 126/71 (!) 135/98 114/69  Pulse: 65 79 85 71  Resp: 13 16 17 12   Temp:  98.6 F (37 C)  98.2 F (36.8 C)  TempSrc:  Oral  Oral  SpO2: 100% 97% 98% 94%  Weight:      Height:        Constitutional: Alert and awake, oriented x3, not in any acute distress. Eyes: PERLA, EOMI, irises appear normal, anicteric sclera,  ENMT: external ears and nose appear normal     Neck: neck appears normal, no masses, normal ROM, no thyromegaly, no JVD  CVS: S1-S2 clear, no murmur rubs or gallops, no LE edema, normal pedal pulses  Respiratory:  clear to auscultation bilaterally, no wheezing, rales or rhonchi. Respiratory effort normal. No accessory muscle use.  Abdomen: soft nontender, nondistended, normal bowel sounds, no hepatosplenomegaly, no hernias  Musculoskeletal: : no cyanosis, clubbing or edema noted bilaterally Neuro: Cranial nerves II-XII intact, strength, sensation, reflexes Psych: judgement and insight appear normal, stable mood and affect, mental status Skin: no rashes or lesions or ulcers, no induration or nodules    Data reviewed:  I have personally reviewed following labs and imaging studies Labs:  CBC: Recent Labs  Lab 05/13/21 2000  WBC 9.0  NEUTROABS 5.6  HGB 15.0  HCT 44.3  MCV 89.3  PLT 346    Basic Metabolic Panel: Recent Labs  Lab 05/13/21 2000  NA 138  K 3.9  CL 106  CO2 25  GLUCOSE 91  BUN 11  CREATININE 0.92  CALCIUM 9.1   GFR Estimated Creatinine Clearance: 93.9 mL/min (by C-G formula based on SCr of 0.92 mg/dL). Liver Function Tests: Recent Labs  Lab 05/13/21 2000  AST 21  ALT 12  ALKPHOS 46  BILITOT 0.6  PROT 7.3  ALBUMIN 4.3   No results for input(s): LIPASE, AMYLASE in the last 168 hours. No results for input(s): AMMONIA in the last 168 hours. Coagulation profile No results for input(s): INR, PROTIME in the  last 168 hours.  Cardiac Enzymes: No results for input(s): CKTOTAL, CKMB, CKMBINDEX, TROPONINI in the last 168 hours. BNP: Invalid input(s): POCBNP CBG: No results for input(s): GLUCAP in the last 168 hours. D-Dimer No results for input(s): DDIMER in the last 72 hours. Hgb A1c No results for input(s): HGBA1C in the last 72 hours. Lipid Profile No results for input(s): CHOL, HDL, LDLCALC, TRIG, CHOLHDL, LDLDIRECT in the last 72 hours. Thyroid function studies No results for input(s): TSH, T4TOTAL, T3FREE, THYROIDAB in the last 72 hours.  Invalid input(s): FREET3 Anemia work up No results for input(s): VITAMINB12, FOLATE, FERRITIN, TIBC, IRON, RETICCTPCT in the last 72 hours. Urinalysis    Component Value Date/Time   COLORURINE YELLOW 01/04/2014 0943   APPEARANCEUR CLOUDY (A) 01/04/2014 0943   LABSPEC 1.023 01/04/2014 0943   PHURINE 5.5 01/04/2014 0943   GLUCOSEU  NEGATIVE 01/04/2014 0943   HGBUR LARGE (A) 01/04/2014 0943   BILIRUBINUR NEGATIVE 01/04/2014 0943   KETONESUR NEGATIVE 01/04/2014 0943   PROTEINUR NEGATIVE 01/04/2014 0943   UROBILINOGEN 0.2 01/04/2014 0943   NITRITE NEGATIVE 01/04/2014 0943   LEUKOCYTESUR NEGATIVE 01/04/2014 0943     Microbiology Recent Results (from the past 240 hour(s))  Resp Panel by RT-PCR (Flu A&B, Covid) Nasopharyngeal Swab     Status: None   Collection Time: 05/14/21 12:12 AM   Specimen: Nasopharyngeal Swab; Nasopharyngeal(NP) swabs in vial transport medium  Result Value Ref Range Status   SARS Coronavirus 2 by RT PCR NEGATIVE NEGATIVE Final    Comment: (NOTE) SARS-CoV-2 target nucleic acids are NOT DETECTED.  The SARS-CoV-2 RNA is generally detectable in upper respiratory specimens during the acute phase of infection. The lowest concentration of SARS-CoV-2 viral copies this assay can detect is 138 copies/mL. A negative result does not preclude SARS-Cov-2 infection and should not be used as the sole basis for treatment or other  patient management decisions. A negative result may occur with  improper specimen collection/handling, submission of specimen other than nasopharyngeal swab, presence of viral mutation(s) within the areas targeted by this assay, and inadequate number of viral copies(<138 copies/mL). A negative result must be combined with clinical observations, patient history, and epidemiological information. The expected result is Negative.  Fact Sheet for Patients:  BloggerCourse.com  Fact Sheet for Healthcare Providers:  SeriousBroker.it  This test is no t yet approved or cleared by the [place] FDA and  has been authorized for detection and/or diagnosis of SARS-CoV-2 by FDA under an Emergency Use Authorization (EUA). This EUA will remain  in effect (meaning this test can be used) for the duration of the COVID-19 declaration under Section 564(b)(1) of the Act, 21 U.S.C.section 360bbb-3(b)(1), unless the authorization is terminated  or revoked sooner.       Influenza A by PCR NEGATIVE NEGATIVE Final   Influenza B by PCR NEGATIVE NEGATIVE Final    Comment: (NOTE) The Xpert Xpress SARS-CoV-2/FLU/RSV plus assay is intended as an aid in the diagnosis of influenza from Nasopharyngeal swab specimens and should not be used as a sole basis for treatment. Nasal washings and aspirates are unacceptable for Xpert Xpress SARS-CoV-2/FLU/RSV testing.  Fact Sheet for Patients: BloggerCourse.com  Fact Sheet for Healthcare Providers: SeriousBroker.it  This test is not yet approved or cleared by the [place] FDA and has been authorized for detection and/or diagnosis of SARS-CoV-2 by FDA under an Emergency Use Authorization (EUA). This EUA will remain in effect (meaning this test can be used) for the duration of the COVID-19 declaration under Section 564(b)(1) of the Act, 21 U.S.C. section  360bbb-3(b)(1), unless the authorization is terminated or revoked.  Performed at [facility], [address] [place], [place] [address]        Inpatient Medications:   Scheduled Meds:  nicotine  21 mg Transdermal Once   Continuous Infusions:   Radiological Exams on Admission: CT Head Wo Contrast  Result Date: 05/13/2021 CLINICAL DATA:  Transient vertigo, history of seizures EXAM: CT HEAD WITHOUT CONTRAST TECHNIQUE: Contiguous axial images were obtained from the base of the skull through the vertex without intravenous contrast. COMPARISON:  03/22/2021, 12/25/2018 FINDINGS: Brain: No acute infarct or hemorrhage. Stable right frontal lobe cavernoma abutting the corpus callosum unchanged. Lateral ventricles and remaining midline structures are unremarkable. No acute extra-axial fluid collections. No mass effect. Vascular: No hyperdense vessel or unexpected calcification. Skull: Normal. Negative  for fracture or focal lesion. Sinuses/Orbits: No acute finding. Other: None. IMPRESSION: 1. No acute intracranial process. 2. Stable right frontal lobe cavernoma. Electronically Signed   By: Sharlet Salina M.D.   On: 05/13/2021 20:42   MR ANGIO HEAD WO CONTRAST  Result Date: 05/14/2021 CLINICAL DATA:  Neuro deficit, acute, stroke suspected. Additional history obtained from prior radiology records: Transient vertigo, history of seizures. EXAM: MRI HEAD WITHOUT CONTRAST MRA HEAD WITHOUT CONTRAST MRA NECK WITHOUT AND WITH CONTRAST TECHNIQUE: Multiplanar, multi-echo pulse sequences of the brain and surrounding structures were acquired without intravenous contrast. Angiographic images of the Circle of Willis were acquired using MRA technique without intravenous contrast. Angiographic images of the neck were acquired using MRA technique without and with intravenous contrast. Carotid stenosis measurements (when applicable) are obtained utilizing NASCET criteria, using the distal internal carotid  diameter as the denominator. CONTRAST:  7.1mL GADAVIST GADOBUTROL 1 MMOL/ML IV SOLN COMPARISON:  Head CT 05/13/2021. Brain MRI 12/25/2018. FINDINGS: MRI HEAD FINDINGS Brain: Cerebral volume is normal for age. Minimal scattered and ill-defined T2/FLAIR hyperintensity within the bilateral cerebral white matter, nonspecific but compatible with chronic small vessel ischemic disease. Unchanged subcentimeter chronic hemorrhage within the medial right frontal lobe (just above the corpus callosum), compatible with cavernoma. There is no acute infarct. No evidence of intracranial mass. No chronic intracranial blood products. No extra-axial fluid collection. No midline shift. Vascular: Expected proximal arterial flow voids. Skull and upper cervical spine: No focal marrow lesion. Sinuses/Orbits: Visualized orbits show no acute finding. No significant paranasal sinus disease. MRA HEAD FINDINGS Anterior circulation: The intracranial internal carotid arteries are patent. The M1 middle cerebral arteries are patent. No M2 proximal branch occlusion or high-grade proximal stenosis is identified. The anterior cerebral arteries are patent. 2 mm posterior projecting vascular protrusion arising from the proximal right cavernous ICA, which may reflect an aneurysm or infundibulum (series 1, image 71). Posterior circulation: The intracranial vertebral arteries are patent. Mild nonspecific focal fusiform dilation of the V4 right vertebral artery (series 1042, image 13). The basilar artery is patent. The posterior cerebral arteries are patent. The posterior cerebral arteries are fetal in origin bilaterally. Anatomic variants: As described. MRA NECK FINDINGS Aortic arch: Standard aortic branching. The visualized aortic arch is normal in caliber. No hemodynamically significant innominate or proximal subclavian artery stenosis. Right carotid system: The common carotid and internal carotid arteries are patent within the neck without appreciable  stenosis. Left carotid system: The common carotid and internal carotid arteries are patent within the neck without appreciable stenosis. Vertebral arteries: The vertebral arteries are codominant and patent within the neck with antegrade flow, and without appreciable stenosis. IMPRESSION: MRI brain: 1. No evidence of acute intracranial abnormality. 2. 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 No surrounding edema or evidence of interval hemorrhage. 3. Minimal chronic small vessel ischemic changes within the cerebral white matter, unchanged. MRA head: 1. No intracranial large vessel occlusion or proximal arterial stenosis. 2. 2 mm posteriorly projecting vascular protrusion arising from the proximal cavernous right ICA, which may reflect an aneurysm or infundibulum. 3. Mild nonspecific focal fusiform dilation of the V4 right vertebral artery. A small fusiform aneurysm cannot be excluded. MRA neck: The common carotid, internal carotid and vertebral arteries are patent within the neck without appreciable stenosis. Electronically Signed   By: Jackey Loge DO   On: 05/14/2021 19:02   MR Angiogram Neck W or Wo Contrast  Result Date: 05/14/2021 CLINICAL DATA:  Neuro deficit, acute, stroke suspected. Additional  history obtained from prior radiology records: Transient vertigo, history of seizures. EXAM: MRI HEAD WITHOUT CONTRAST MRA HEAD WITHOUT CONTRAST MRA NECK WITHOUT AND WITH CONTRAST TECHNIQUE: Multiplanar, multi-echo pulse sequences of the brain and surrounding structures were acquired without intravenous contrast. Angiographic images of the Circle of Willis were acquired using MRA technique without intravenous contrast. Angiographic images of the neck were acquired using MRA technique without and with intravenous contrast. Carotid stenosis measurements (when applicable) are obtained utilizing NASCET criteria, using the distal internal carotid diameter as the denominator. CONTRAST:   7.56mL GADAVIST GADOBUTROL 1 MMOL/ML IV SOLN COMPARISON:  Head CT 05/13/2021. Brain MRI 12/25/2018. FINDINGS: MRI HEAD FINDINGS Brain: Cerebral volume is normal for age. Minimal scattered and ill-defined T2/FLAIR hyperintensity within the bilateral cerebral white matter, nonspecific but compatible with chronic small vessel ischemic disease. Unchanged subcentimeter chronic hemorrhage within the medial right frontal lobe (just above the corpus callosum), compatible with cavernoma. There is no acute infarct. No evidence of intracranial mass. No chronic intracranial blood products. No extra-axial fluid collection. No midline shift. Vascular: Expected proximal arterial flow voids. Skull and upper cervical spine: No focal marrow lesion. Sinuses/Orbits: Visualized orbits show no acute finding. No significant paranasal sinus disease. MRA HEAD FINDINGS Anterior circulation: The intracranial internal carotid arteries are patent. The M1 middle cerebral arteries are patent. No M2 proximal branch occlusion or high-grade proximal stenosis is identified. The anterior cerebral arteries are patent. 2 mm posterior projecting vascular protrusion arising from the proximal right cavernous ICA, which may reflect an aneurysm or infundibulum (series 1, image 71). Posterior circulation: The intracranial vertebral arteries are patent. Mild nonspecific focal fusiform dilation of the V4 right vertebral artery (series 1042, image 13). The basilar artery is patent. The posterior cerebral arteries are patent. The posterior cerebral arteries are fetal in origin bilaterally. Anatomic variants: As described. MRA NECK FINDINGS Aortic arch: Standard aortic branching. The visualized aortic arch is normal in caliber. No hemodynamically significant innominate or proximal subclavian artery stenosis. Right carotid system: The common carotid and internal carotid arteries are patent within the neck without appreciable stenosis. Left carotid system: The common  carotid and internal carotid arteries are patent within the neck without appreciable stenosis. Vertebral arteries: The vertebral arteries are codominant and patent within the neck with antegrade flow, and without appreciable stenosis. IMPRESSION: MRI brain: 1. No evidence of acute intracranial abnormality. 2. 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 No surrounding edema or evidence of interval hemorrhage. 3. Minimal chronic small vessel ischemic changes within the cerebral white matter, unchanged. MRA head: 1. No intracranial large vessel occlusion or proximal arterial stenosis. 2. 2 mm posteriorly projecting vascular protrusion arising from the proximal cavernous right ICA, which may reflect an aneurysm or infundibulum. 3. Mild nonspecific focal fusiform dilation of the V4 right vertebral artery. A small fusiform aneurysm cannot be excluded. MRA neck: The common carotid, internal carotid and vertebral arteries are patent within the neck without appreciable stenosis. Electronically Signed   By: Jackey Loge DO   On: 05/14/2021 19:02   MR BRAIN WO CONTRAST  Result Date: 05/14/2021 CLINICAL DATA:  Neuro deficit, acute, stroke suspected. Additional history obtained from prior radiology records: Transient vertigo, history of seizures. EXAM: MRI HEAD WITHOUT CONTRAST MRA HEAD WITHOUT CONTRAST MRA NECK WITHOUT AND WITH CONTRAST TECHNIQUE: Multiplanar, multi-echo pulse sequences of the brain and surrounding structures were acquired without intravenous contrast. Angiographic images of the Circle of Willis were acquired using MRA technique without intravenous contrast. Angiographic images of  the neck were acquired using MRA technique without and with intravenous contrast. Carotid stenosis measurements (when applicable) are obtained utilizing NASCET criteria, using the distal internal carotid diameter as the denominator. CONTRAST:  7.50mL GADAVIST GADOBUTROL 1 MMOL/ML IV SOLN COMPARISON:   Head CT 05/13/2021. Brain MRI 12/25/2018. FINDINGS: MRI HEAD FINDINGS Brain: Cerebral volume is normal for age. Minimal scattered and ill-defined T2/FLAIR hyperintensity within the bilateral cerebral white matter, nonspecific but compatible with chronic small vessel ischemic disease. Unchanged subcentimeter chronic hemorrhage within the medial right frontal lobe (just above the corpus callosum), compatible with cavernoma. There is no acute infarct. No evidence of intracranial mass. No chronic intracranial blood products. No extra-axial fluid collection. No midline shift. Vascular: Expected proximal arterial flow voids. Skull and upper cervical spine: No focal marrow lesion. Sinuses/Orbits: Visualized orbits show no acute finding. No significant paranasal sinus disease. MRA HEAD FINDINGS Anterior circulation: The intracranial internal carotid arteries are patent. The M1 middle cerebral arteries are patent. No M2 proximal branch occlusion or high-grade proximal stenosis is identified. The anterior cerebral arteries are patent. 2 mm posterior projecting vascular protrusion arising from the proximal right cavernous ICA, which may reflect an aneurysm or infundibulum (series 1, image 71). Posterior circulation: The intracranial vertebral arteries are patent. Mild nonspecific focal fusiform dilation of the V4 right vertebral artery (series 1042, image 13). The basilar artery is patent. The posterior cerebral arteries are patent. The posterior cerebral arteries are fetal in origin bilaterally. Anatomic variants: As described. MRA NECK FINDINGS Aortic arch: Standard aortic branching. The visualized aortic arch is normal in caliber. No hemodynamically significant innominate or proximal subclavian artery stenosis. Right carotid system: The common carotid and internal carotid arteries are patent within the neck without appreciable stenosis. Left carotid system: The common carotid and internal carotid arteries are patent within  the neck without appreciable stenosis. Vertebral arteries: The vertebral arteries are codominant and patent within the neck with antegrade flow, and without appreciable stenosis. IMPRESSION: MRI brain: 1. No evidence of acute intracranial abnormality. 2. 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 No surrounding edema or evidence of interval hemorrhage. 3. Minimal chronic small vessel ischemic changes within the cerebral white matter, unchanged. MRA head: 1. No intracranial large vessel occlusion or proximal arterial stenosis. 2. 2 mm posteriorly projecting vascular protrusion arising from the proximal cavernous right ICA, which may reflect an aneurysm or infundibulum. 3. Mild nonspecific focal fusiform dilation of the V4 right vertebral artery. A small fusiform aneurysm cannot be excluded. MRA neck: The common carotid, internal carotid and vertebral arteries are patent within the neck without appreciable stenosis. Electronically Signed   By: Jackey Loge DO   On: 05/14/2021 19:02    Impression/Recommendations Active Problems:   Vertigo  Resolved vertigo MRI brain nonacute. Patient advised to follow-up with his primary neurologist, he understands and agrees with plan.  2 mm posteriorly projecting vascular protrusion arising from the proximal cavernous right ICA, which may reflect an aneurysm or infundibulum Follow-up with your primary neurologist.  Patient made aware of this finding.    Vital signs and labs have been reviewed and are stable.  Patient's symptoms have resolved.  Discussed with neurology Dr. Wilford Corner who reviewed imagings and agrees with discharging the patient.  Patient discharged from the ED, to follow-up with his primary neurologist and PCP.   Thank you for this consultation.        Time Spent: 35 minutes.  Darlin Drop M.D. [facility] 05/14/2021, 10:43 PM

## 2021-05-14 NOTE — ED Notes (Signed)
Pt transported to MRI 

## 2021-05-14 NOTE — ED Notes (Signed)
This RN was called over to MRI for a possible MRI contrast reaction. Went over to see pt and pt hot to touch. Pt reports when contrast entered IV it felt like "lava" going straight to his head and eyes. Pt reported after the hot sensation, he started feeling cold. Pt reports he has been told that he is sensitive to "dyes". Delford Field MD notified and came to MRI to see pt. VSS.

## 2021-05-14 NOTE — ED Provider Notes (Signed)
I was called to MRI emergently.  The patient had just received IV contrast, and he was experiencing symptoms of an allergic reaction.  When I saw him, he was sitting up on the edge of the stretcher.  He states that he had a feeling as if lava was going through him.  He had some brief shortness of breath.  He is currently not experiencing any shortness of breath.  Lungs are clear to auscultation.  He has no trouble with his airway.  No rash.  We will give him IV hydration and mark MRI dye as an allergy in his chart.   Koleen Distance, MD 05/14/21 1754

## 2021-06-25 IMAGING — MR MR MRA HEAD W/O CM
1 series · 17 of 48 positions shown · IV contrast (gadavist)
Comparison: Head CT 05/13/2021. Brain MRI 12/25/2018.

CLINICAL DATA: Neuro deficit, acute, stroke suspected. Additional
history obtained from prior radiology records: Transient vertigo,
history of seizures.

EXAM:
MRI HEAD WITHOUT CONTRAST
MRA HEAD WITHOUT CONTRAST
MRA NECK WITHOUT AND WITH CONTRAST
TECHNIQUE: Multiplanar, multi-echo pulse sequences of the brain and surrounding
structures were acquired without intravenous contrast. Angiographic
images of the Circle of Willis were acquired using MRA technique
without intravenous contrast. Angiographic images of the neck were
acquired using MRA technique without and with intravenous contrast.
Carotid stenosis measurements (when applicable) are obtained
utilizing NASCET criteria, using the distal internal carotid
diameter as the denominator.
CONTRAST:  7.5mL GADAVIST GADOBUTROL 1 MMOL/ML IV SOLN

[Series 1: 3d cow · axial · 0.5mm · 0.41mm/px · z∈[-114,-34]mm · 17 of 172 slices shown]
[im 1/172]
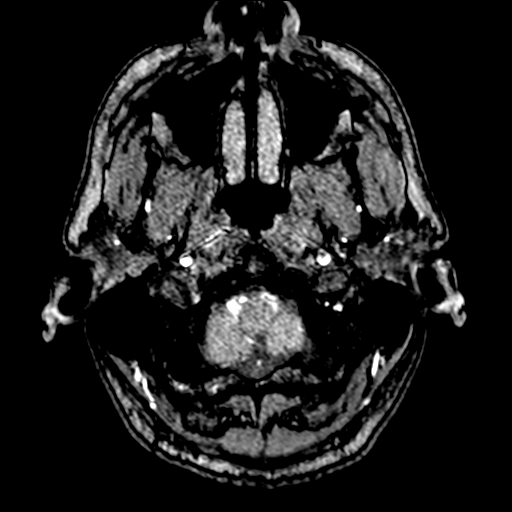
[im 4/172]
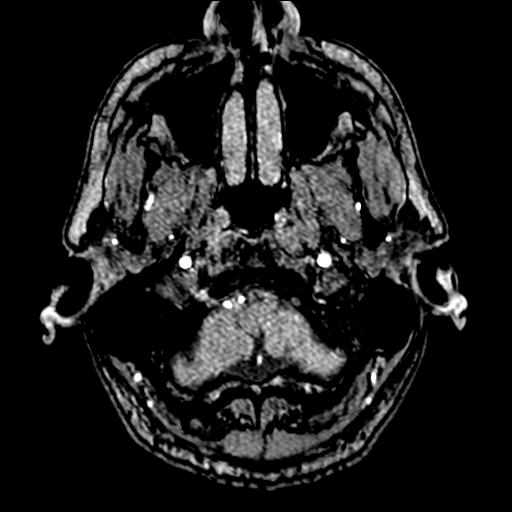
[im 8/172]
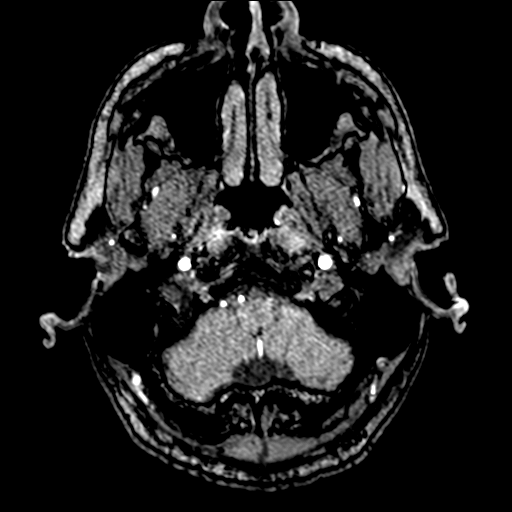
[im 11/172]
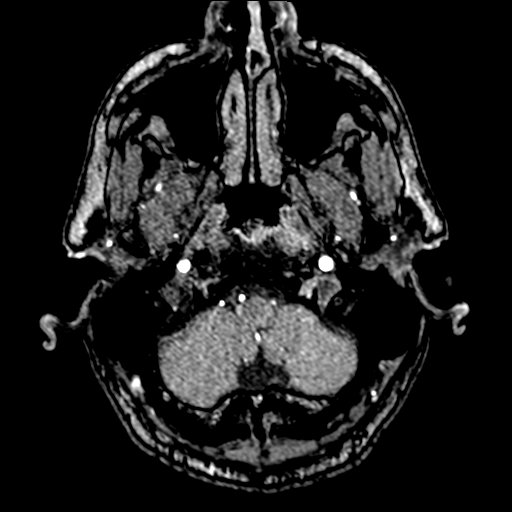
[im 15/172]
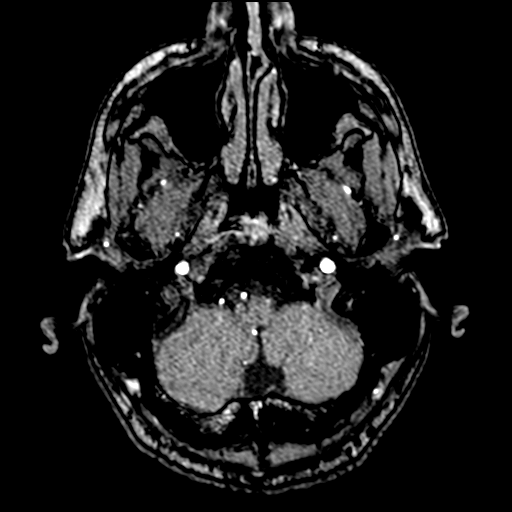
[im 19/172]
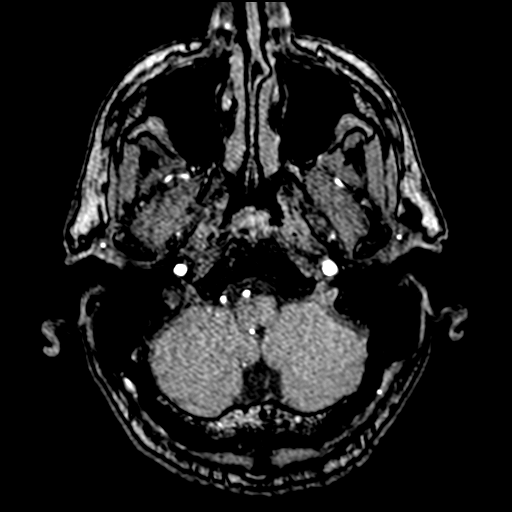
[im 22/172]
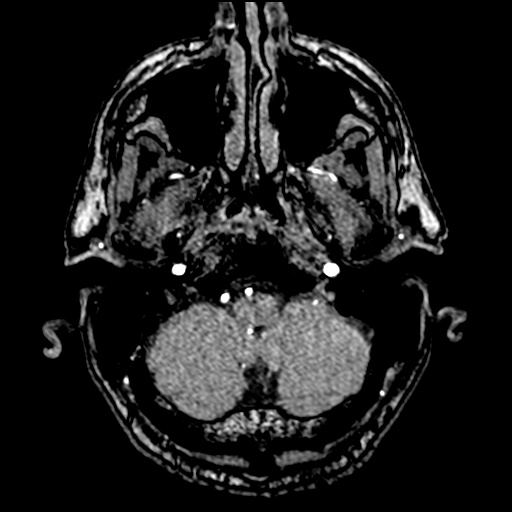
[im 30/172]
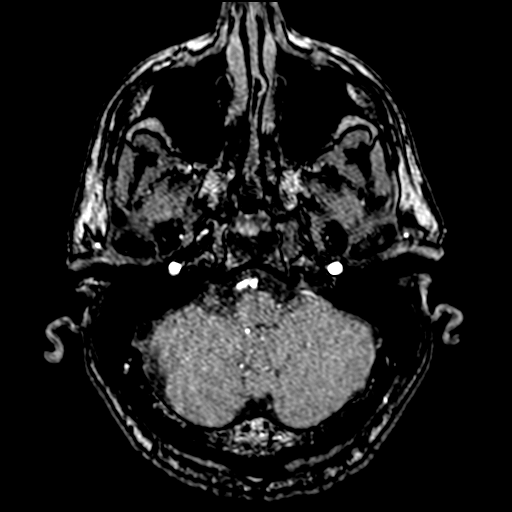
[im 33/172]
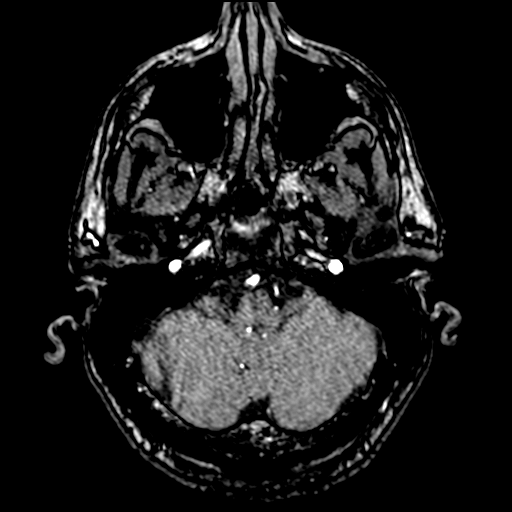
[im 55/172]
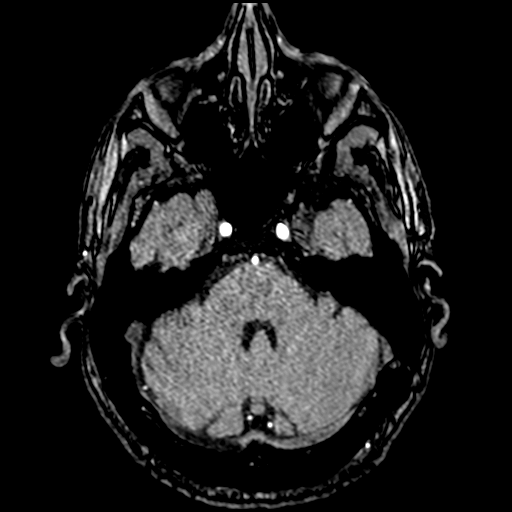
[im 77/172]
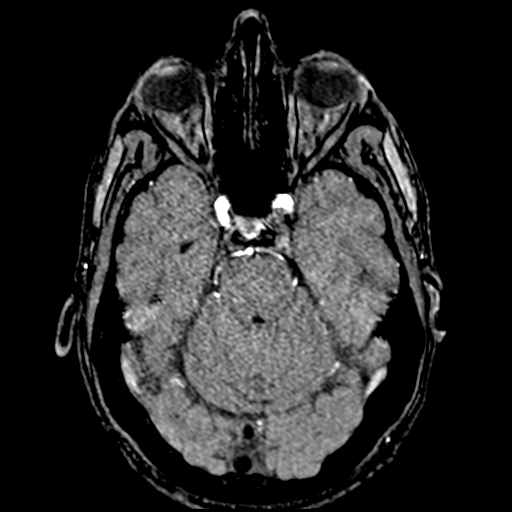
[im 88/172]
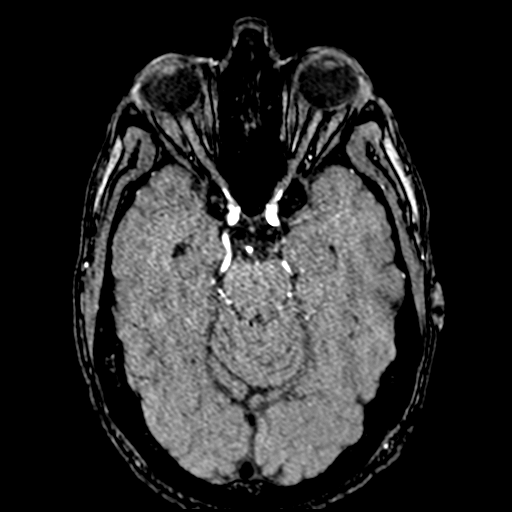
[im 99/172]
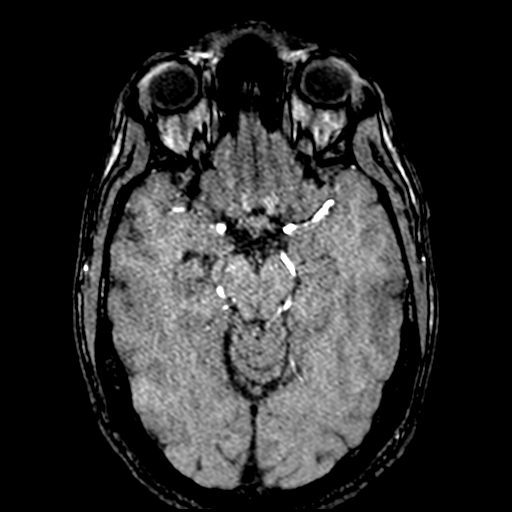
[im 121/172]
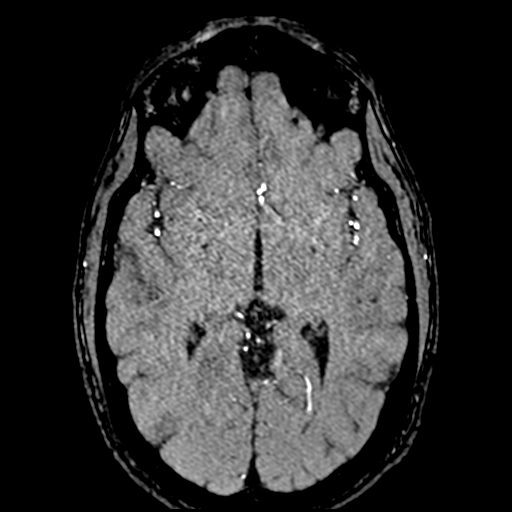
[im 142/172]
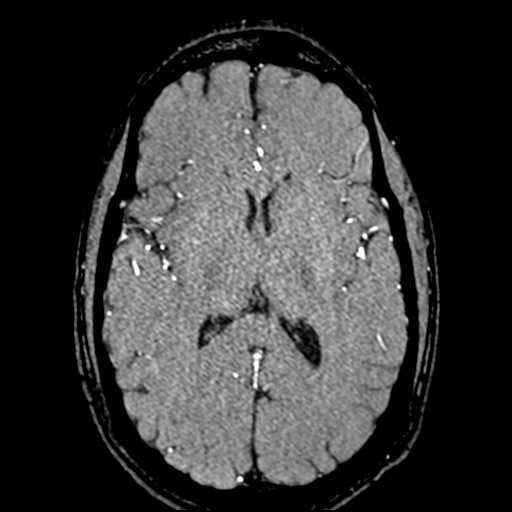
[im 146/172]
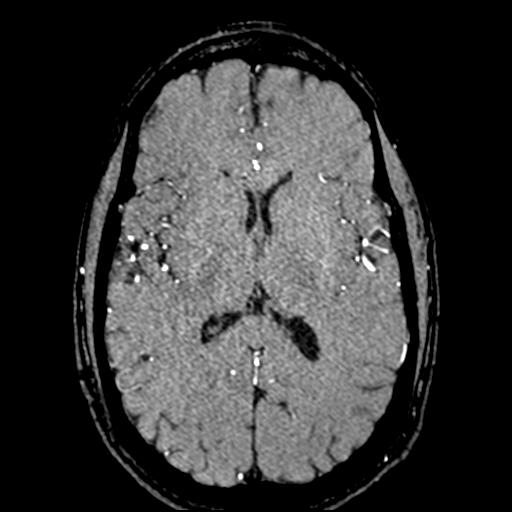
[im 164/172]
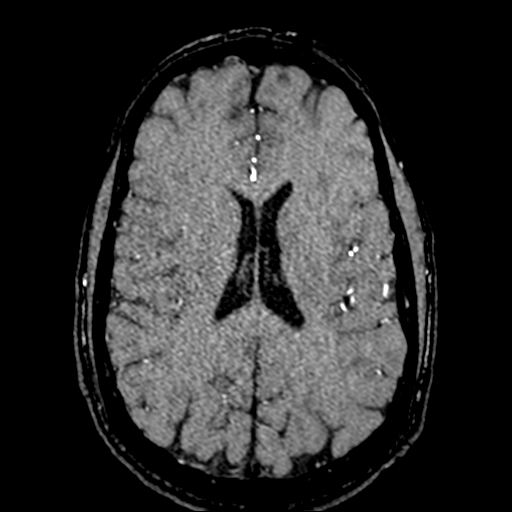

[17 of 48 positions shown; findings below may reference images not displayed]

FINDINGS: MRI HEAD FINDINGS

Brain:

Cerebral volume is normal for age.

Minimal scattered and ill-defined T2/FLAIR hyperintensity within the
bilateral cerebral white matter, nonspecific but compatible with
chronic small vessel ischemic disease.

Unchanged subcentimeter chronic hemorrhage within the medial right
frontal lobe (just above the corpus callosum), compatible with
cavernoma.

There is no acute infarct.

No evidence of intracranial mass.

No chronic intracranial blood products.

No extra-axial fluid collection.

No midline shift.

Vascular: Expected proximal arterial flow voids.

Skull and upper cervical spine: No focal marrow lesion.

Sinuses/Orbits: Visualized orbits show no acute finding. No
significant paranasal sinus disease.

MRA HEAD FINDINGS

Anterior circulation:

The intracranial internal carotid arteries are patent. The M1 middle
cerebral arteries are patent. No M2 proximal branch occlusion or
high-grade proximal stenosis is identified. The anterior cerebral
arteries are patent. 2 mm posterior projecting vascular protrusion
arising from the proximal right cavernous ICA, which may reflect an
aneurysm or infundibulum (series 1, image 71).

Posterior circulation:

The intracranial vertebral arteries are patent. Mild nonspecific
focal fusiform dilation of the V4 right vertebral artery (series
1377, image 13). The basilar artery is patent. The posterior
cerebral arteries are patent. The posterior cerebral arteries are
fetal in origin bilaterally.

Anatomic variants: As described.

MRA NECK FINDINGS

Aortic arch: Standard aortic branching. The visualized aortic arch
is normal in caliber. No hemodynamically significant innominate or
proximal subclavian artery stenosis.

Right carotid system: The common carotid and internal carotid
arteries are patent within the neck without appreciable stenosis.

Left carotid system: The common carotid and internal carotid
arteries are patent within the neck without appreciable stenosis.

Vertebral arteries: The vertebral arteries are codominant and patent
within the neck with antegrade flow, and without appreciable
stenosis.
IMPRESSION: MRI brain:

1. No evidence of acute intracranial abnormality.
2. Redemonstrated subcentimeter cavernoma within the medial right
frontal lobe. No surrounding edema or evidence of interval
hemorrhage.
3. Minimal chronic small vessel ischemic changes within the cerebral
white matter, unchanged.

MRA head:

1. No intracranial large vessel occlusion or proximal arterial
stenosis.
2. 2 mm posteriorly projecting vascular protrusion arising from the
proximal cavernous right ICA, which may reflect an aneurysm or
infundibulum.
3. Mild nonspecific focal fusiform dilation of the V4 right
vertebral artery. A small fusiform aneurysm cannot be excluded.

MRA neck:

The common carotid, internal carotid and vertebral arteries are
patent within the neck without appreciable stenosis.

## 2021-07-07 NOTE — Discharge Summary (Signed)
Discharge Summary  Eugene Fields VCB:[id number] DOB: [birth year]  PCP: Eugene Fields., MD  Admit date: 05/13/2021 Discharge date: 07/07/2021  Time spent: 25 minutes   Recommendations for Outpatient Follow-up:  Follow up with your primary neurologist Take your medications as prescribed.  Discharge Diagnoses:  Active Hospital Problems   Diagnosis Date Noted   Vertigo 05/14/2021    Resolved Hospital Problems  No resolved problems to display.    Discharge Condition: Stable  Diet recommendation: Resume previous diet.  Vitals:   05/14/21 1804 05/14/21 2314  BP: 114/69 (!) 152/68  Pulse: 71 82  Resp: 12 18  Temp: 98.2 F (36.8 C) 98.5 F (36.9 C)  SpO2: 94% 97%    History of present illness:   Eugene Fields is an 55 y.o. male chronic anxiety, tobacco use disorder presented initially to [facility] with complaints of vertigo..  Per teleneurology Dr. Kallie Fields pt was sitting in a recliner in the backyard. Then suddenly everything started spinning counter clockwise for 2-3 minutes. Then everything switched directions and started spinning clockwise which lasted a few minutes too and then stopped. This was completely different from his usual dizzy spells and vertigo that he's experienced before. No alcohol, no drugs, no allergies, no recent illness, no head trauma. Pt does have cough and congestion since this AM but nothing severe. Pt denies any associated focal neurologic or bulbar deficit. He does still have a headache. Over the last few months he's been having headaches daily, pressure tension-type headaches. No other associated symptoms. One time 2 years ago, for a period of 10 seconds he had an episode of feeling like he was falling to the L (he was sitting so no actual fall), it resolved so no medical attention was sought. Pt takes clonazepam daily in the evening for sleep, has not missed any doses. Of note, he was in the hospital 2 months ago because of heaviness in  the LUE. He reports that stroke was ruled out, he has had an EMG, no etiology was found. He sees a neurologist for this. Pt reports that he's had an arrhythmia since he was 22 years, but he is not on any medication for it. He has had a couple of stress tests which were normal.   Patient was transferred to [facility] ED, had an MRI brain without contrast, MRA head and neck with and without contrast.  No acute findings.  At the time of this visit, his symptomatology had resolved.  Patient advised to follow-up with his primary neurologist outpatient.  Patient understands and agrees with plan.        IMPRESSION: MRI brain:   1. No evidence of acute intracranial abnormality. 2. 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 No surrounding edema or evidence of interval hemorrhage. 3. Minimal chronic small vessel ischemic changes within the cerebral white matter, unchanged.   MRA head:   1. No intracranial large vessel occlusion or proximal arterial stenosis. 2. 2 mm posteriorly projecting vascular protrusion arising from the proximal cavernous right ICA, which may reflect an aneurysm or infundibulum. 3. Mild nonspecific focal fusiform dilation of the V4 right vertebral artery. A small fusiform aneurysm cannot be excluded.   MRA neck:   The common carotid, internal carotid and vertebral arteries are patent within the neck without appreciable stenosis.     Electronically Signed   By: Eugene Loge DO   On: 05/14/2021 19:02   Hospital Course:  Active Problems:   Vertigo  Resolved vertigo  MRI brain nonacute. Patient advised to follow-up with his primary neurologist, he understands and agrees with plan.   2 mm posteriorly projecting vascular protrusion arising from the proximal cavernous right ICA, which may reflect an aneurysm or infundibulum Follow-up with your primary neurologist.  Patient made aware of this finding.       Vital signs and labs have  been reviewed and are stable.  Patient's symptoms have resolved.  Discussed with neurology Dr. Wilford Fields who reviewed imagings and agrees with discharging the patient.  Patient discharged from the ED, to follow-up with his primary neurologist and PCP.       Procedures: MRI brain   Consultations: Curbsided with neurology Dr. Wilford Fields.   Discharge Exam: BP (!) 152/68   Pulse 82   Temp 98.5 F (36.9 C) (Oral)   Resp 18   Ht 5\' 9"  (1.753 m)   Wt 77.1 kg   SpO2 97%   BMI 25.10 kg/m  General: 55 y.o. year-old male well developed well nourished in no acute distress.  Alert and oriented x3. Cardiovascular: Regular rate and rhythm with no rubs or gallops.  No thyromegaly or JVD noted.   Respiratory: Clear to auscultation with no wheezes or rales. Good inspiratory effort. Abdomen: Soft nontender nondistended with normal bowel sounds x4 quadrants. Musculoskeletal: No lower extremity edema. 2/4 pulses in all 4 extremities. Skin: No ulcerative lesions noted or rashes, Psychiatry: Mood is appropriate for condition and setting  Discharge Instructions You were cared for by a hospitalist during your hospital stay. If you have any questions about your discharge medications or the care you received while you were in the hospital after you are discharged, you can call the unit and asked to speak with the hospitalist on call if the hospitalist that took care of you is not available. Once you are discharged, your primary care physician will handle any further medical issues. Please note that NO REFILLS for any discharge medications will be authorized once you are discharged, as it is imperative that you return to your primary care physician (or establish a relationship with a primary care physician if you do not have one) for your aftercare needs so that they can reassess your need for medications and monitor your lab values.   Allergies as of 05/14/2021       Reactions   Gadavist [gadobutrol] Other (See  Comments)   Pt reports a feeling of "lava" shooting through arm and to his head and eyes after administration.    Penicillins         Medication List     STOP taking these medications    B-6 50 MG Tabs       TAKE these medications    clonazePAM 0.5 MG tablet Commonly known as: KLONOPIN Take by mouth daily.   Vitamin B-12 1000 MCG Subl Place 1 tablet under the tongue daily.       ASK your doctor about these medications    nicotine 21 mg/24hr patch Commonly known as: NICODERM CQ - dosed in mg/24 hours Place 1 patch (21 mg total) onto the skin once for 1 dose. Ask about: Should I take this medication?       Allergies  Allergen Reactions   Gadavist [Gadobutrol] Other (See Comments)    Pt reports a feeling of "lava" shooting through arm and to his head and eyes after administration.    Penicillins     Follow-up Information     05/16/2021., MD. Call in 1  day(s).   Specialty: Internal Medicine Why: Please call for a post hospital follow-up appointment. Contact information: [address] [place] [address] [phone]         Denese Killings., MD. Call in 1 day(s).   Specialty: Neurology Why: Please call for a posthospital follow-up appointment. Contact information: [address] [place] [address] [phone]                  The results of significant diagnostics from this hospitalization (including imaging, microbiology, ancillary and laboratory) are listed below for reference.    Significant Diagnostic Studies: No results found.  Microbiology: No results found for this or any previous visit (from the past 240 hour(s)).   Labs: Basic Metabolic Panel: No results for input(s): NA, K, CL, CO2, GLUCOSE, BUN, CREATININE, CALCIUM, MG, PHOS in the last 168 hours. Liver Function Tests: No results for input(s): AST, ALT, ALKPHOS, BILITOT, PROT, ALBUMIN in the last 168 hours. No results for input(s):  LIPASE, AMYLASE in the last 168 hours. No results for input(s): AMMONIA in the last 168 hours. CBC: No results for input(s): WBC, NEUTROABS, HGB, HCT, MCV, PLT in the last 168 hours. Cardiac Enzymes: No results for input(s): CKTOTAL, CKMB, CKMBINDEX, TROPONINI in the last 168 hours. BNP: BNP (last 3 results) No results for input(s): BNP in the last 8760 hours.  ProBNP (last 3 results) No results for input(s): PROBNP in the last 8760 hours.  CBG: No results for input(s): GLUCAP in the last 168 hours.     Signed:  Darlin Drop, MD [facility] 07/07/2021, 8:06 PM

## 2021-12-25 ENCOUNTER — Ambulatory Visit
Admission: EM | Admit: 2021-12-25 | Discharge: 2021-12-25 | Disposition: A | Discharge status: Home / Self Care | Payer: PRIVATE HEALTH INSURANCE | Attending: Family Medicine | Admitting: Family Medicine

## 2021-12-25 ENCOUNTER — Other Ambulatory Visit: Payer: Self-pay

## 2021-12-25 DIAGNOSIS — L309 Dermatitis, unspecified: Secondary | ICD-10-CM

## 2021-12-25 MED ORDER — TRIAMCINOLONE ACETONIDE 40 MG/ML IJ SUSP
40.0000 mg | Freq: Once | INTRAMUSCULAR | Status: AC
  Administered 2021-12-25: 40 mg via INTRAMUSCULAR

## 2021-12-25 NOTE — ED Triage Notes (Signed)
Patient reports having a generalized rash that has gotten worse over the past 5 weeks.

## 2021-12-25 NOTE — ED Provider Notes (Addendum)
[facility]    CSN: [id number] Arrival date & time: 12/25/21  1346      History   Chief Complaint Chief Complaint  Patient presents with   Rash    HPI Eugene Fields is a 56 y.o. male.    Rash Here for rash that has been bothering him for approximately 5 weeks.  He notes irritated and pruritic rash in the antecubital spaces of his arms.  He also notes rash in his inguinal area and in his gluteal crevice and in both axilla.  It is more irritated at night.   No upper respiratory symptoms except for a cough that is coming gone for a while.  He also mentions that for about 2 weeks he has had sweats at night while he feels cold.  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   Subjective tinnitus, bilateral 05/16/2017   Temporomandibular joint (TMJ) pain 05/16/2017   PVC's (premature ventricular contractions) 01/09/2017   Chest pain 09/07/2016   Dizzy spells 09/07/2016   Wrist sprain 07/30/2014   Epigastric abdominal pain 07/30/2014   Acute bronchitis 07/21/2014   Carpal tunnel syndrome 07/21/2014   Chronic cough 07/21/2014   Complex tear of triangular fibrocartilage of right wrist 07/21/2014   Radiocarpal joint sprain 07/21/2014   Diarrhea 07/21/2014   Left lower quadrant pain 07/21/2014   Insomnia 07/21/2014   Generalized anxiety disorder 07/21/2014   Fever 07/21/2014   Fatigue 07/21/2014   Malaise and fatigue 07/21/2014   Nausea 07/21/2014   Neck pain on left side 07/21/2014   Tobacco dependence 07/21/2014   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 Authorizing Provider  clonazePAM (KLONOPIN) 0.5 MG tablet Take by mouth daily.     [provider]  Cyanocobalamin (VITAMIN B-12) 1000 MCG SUBL Place 1 tablet under the tongue daily.    [provider]    Family History Family History  Problem Relation Age of Onset   Lung cancer Brother     Social History Social History   Tobacco Use   Smoking status: Light Smoker    Packs/day: 0.25    Types: Cigarettes, E-cigarettes   Smokeless tobacco: Never  Vaping Use   Vaping Use: Every day  Substance Use Topics   Alcohol use: Yes    Comment: socially   Drug use: No    Comment: CBD     Allergies   Gadavist [gadobutrol] and Penicillins   Review of Systems Review of Systems  Skin:  Positive for rash.    Physical Exam Triage Vital Signs ED Triage Vitals  Enc Vitals Group     BP 12/25/21 1400 139/77     Pulse Rate 12/25/21 1400 64     Resp 12/25/21 1400 20     Temp 12/25/21 1400 98.3 F (36.8 C)     Temp Source 12/25/21 1400 Oral     SpO2 12/25/21 1400 97 %  Weight --      Height --      Head Circumference --      Peak Flow --      Pain Score 12/25/21 1359 2     Pain Loc --      Pain Edu? --      Excl. in [place]? --    No data found.  Updated Vital Signs BP 139/77 (BP Location: Right Arm)    Pulse 64    Temp 98.3 F (36.8 C) (Oral)    Resp 20    SpO2 97%   Visual Acuity Right Eye Distance:   Left Eye Distance:   Bilateral Distance:    Right Eye Near:   Left Eye Near:    Bilateral Near:     Physical Exam Vitals reviewed.  Constitutional:      General: He is not in acute distress.    Appearance: He is not ill-appearing, toxic-appearing or diaphoretic.  Cardiovascular:     Rate and Rhythm: Normal rate and regular rhythm.  Pulmonary:     Effort: Pulmonary effort is normal.     Breath sounds: Normal breath sounds.   Musculoskeletal:     Cervical back: Neck supple.  Lymphadenopathy:     Cervical: No cervical adenopathy.  Skin:    Capillary Refill: Capillary refill takes less than 2 seconds.     Coloration: Skin is not jaundiced or pale.     Comments: Has scaly pink rash in his antecubital spaces bilaterally.  This rash is not confluent and not consistent with yeast.  He has similar scaly patches in his axilla, actually on either side of the axilla.  He also has scaly patches in the lateral aspects of his inguinal areas actually over his lateral hips.  And he has erythematous scaly rash in his gluteal crevice.  For the most part this is not consistent with yeast rash.  No purulent drainage noted  Neurological:     Mental Status: He is alert and oriented to person, place, and time.  Psychiatric:        Behavior: Behavior normal.     UC Treatments / Results  Labs (all labs ordered are listed, but only abnormal results are displayed) Labs Reviewed - No data to display  EKG   Radiology No results found.  Procedures Procedures (including critical care time)  Medications Ordered in UC Medications  triamcinolone acetonide (KENALOG-40) injection 40 mg (has no administration in time range)    Initial Impression / Assessment and Plan / UC Course  I have reviewed the triage vital signs and the nursing notes.  Pertinent labs & imaging results that were available during my care of the patient were reviewed by me and considered in my medical decision making (see chart for details).     He has already  begun to use triamcinolone cream that someone gave him.  He used it once last night.  We will give him a steroid injection and have him use the triamcinolone cream twice a day. He will be seeing his primary doctor in 2 days, so I have asked him to let them know about his night sweats and other symptoms, so they can evaluate.  Also he will show them what his rash has been like and if it is  improving Final Clinical Impressions(s) / UC Diagnoses   Final diagnoses:  Eczema, unspecified type     Discharge Instructions      Use the triamcinolone cream twice daily for  2 to 3 weeks.  You have been given an injection of triamcinolone today.  Use a heavy emollient cream on the rash areas.     ED Prescriptions   None    I have reviewed the PDMP during this encounter.   Barrett Henle, MD 12/25/21 1421    Barrett Henle, MD 12/25/21 [phone]

## 2021-12-25 NOTE — Discharge Instructions (Addendum)
Use the triamcinolone cream twice daily for 2 to 3 weeks.  You have been given an injection of triamcinolone today.  Use a heavy emollient cream on the rash areas.
